# Patient Record
Sex: Female | Born: 1990 | Hispanic: Yes | Marital: Single | Smoking: Never smoker
Health system: Southern US, Community
[De-identification: ages and names within clinical notes are randomized; demographics above are authoritative.]

## PROBLEM LIST (undated history)

## (undated) DIAGNOSIS — K2970 Gastritis, unspecified, without bleeding: Secondary | ICD-10-CM

---

## 2019-06-21 LAB — OB RESULTS CONSOLE ANTIBODY SCREEN: Antibody Screen: NEGATIVE

## 2019-06-21 LAB — OB RESULTS CONSOLE RPR: RPR: NONREACTIVE

## 2019-06-21 LAB — OB RESULTS CONSOLE RUBELLA ANTIBODY, IGM: Rubella: IMMUNE

## 2019-06-21 LAB — OB RESULTS CONSOLE ABO/RH: RH Type: POSITIVE

## 2019-06-21 LAB — OB RESULTS CONSOLE HEPATITIS B SURFACE ANTIGEN: Hepatitis B Surface Ag: NEGATIVE

## 2019-06-21 LAB — OB RESULTS CONSOLE HIV ANTIBODY (ROUTINE TESTING): HIV: NONREACTIVE

## 2020-06-08 ENCOUNTER — Encounter (HOSPITAL_COMMUNITY): Payer: Self-pay | Admitting: Obstetrics & Gynecology

## 2020-06-08 ENCOUNTER — Inpatient Hospital Stay (HOSPITAL_COMMUNITY)
Admission: AD | Admit: 2020-06-08 | Discharge: 2020-06-08 | Disposition: A | Discharge status: Home / Self Care | Payer: Self-pay | Attending: Obstetrics & Gynecology | Admitting: Obstetrics & Gynecology

## 2020-06-08 ENCOUNTER — Other Ambulatory Visit: Payer: Self-pay

## 2020-06-08 DIAGNOSIS — K219 Gastro-esophageal reflux disease without esophagitis: Secondary | ICD-10-CM | POA: Insufficient documentation

## 2020-06-08 DIAGNOSIS — M549 Dorsalgia, unspecified: Secondary | ICD-10-CM | POA: Insufficient documentation

## 2020-06-08 DIAGNOSIS — Z3689 Encounter for other specified antenatal screening: Secondary | ICD-10-CM | POA: Insufficient documentation

## 2020-06-08 DIAGNOSIS — Z3A35 35 weeks gestation of pregnancy: Secondary | ICD-10-CM | POA: Insufficient documentation

## 2020-06-08 DIAGNOSIS — O99613 Diseases of the digestive system complicating pregnancy, third trimester: Secondary | ICD-10-CM | POA: Insufficient documentation

## 2020-06-08 DIAGNOSIS — O26893 Other specified pregnancy related conditions, third trimester: Secondary | ICD-10-CM | POA: Insufficient documentation

## 2020-06-08 HISTORY — DX: Gastritis, unspecified, without bleeding: K29.70

## 2020-06-08 LAB — COMPREHENSIVE METABOLIC PANEL
ALT: 22 U/L (ref 0–44)
AST: 45 U/L — ABNORMAL HIGH (ref 15–41)
Albumin: 2.8 g/dL — ABNORMAL LOW (ref 3.5–5.0)
Alkaline Phosphatase: 142 U/L — ABNORMAL HIGH (ref 38–126)
Anion gap: 6 (ref 5–15)
BUN: <5 mg/dL — ABNORMAL LOW (ref 6–20)
CO2: 21 mmol/L — ABNORMAL LOW (ref 22–32)
Calcium: 8.3 mg/dL — ABNORMAL LOW (ref 8.9–10.3)
Chloride: 108 mmol/L (ref 98–111)
Creatinine, Ser: 0.65 mg/dL (ref 0.44–1.00)
GFR, Estimated: >60 mL/min (ref >60)
Glucose, Bld: 113 mg/dL — ABNORMAL HIGH (ref 70–99)
Potassium: 3.6 mmol/L (ref 3.5–5.1)
Sodium: 135 mmol/L (ref 135–145)
Total Bilirubin: 0.7 mg/dL (ref 0.3–1.2)
Total Protein: 6.5 g/dL (ref 6.5–8.1)

## 2020-06-08 LAB — CBC
HCT: 32.3 % — ABNORMAL LOW (ref 36.0–46.0)
Hemoglobin: 10.5 g/dL — ABNORMAL LOW (ref 12.0–15.0)
MCH: 28.3 pg (ref 26.0–34.0)
MCHC: 32.5 g/dL (ref 30.0–36.0)
MCV: 87.1 fL (ref 80.0–100.0)
Platelets: 270 10*3/uL (ref 150–400)
RBC: 3.71 MIL/uL — ABNORMAL LOW (ref 3.87–5.11)
RDW: 13.1 % (ref 11.5–15.5)
WBC: 9 10*3/uL (ref 4.0–10.5)
nRBC: 0 % (ref 0.0–0.2)

## 2020-06-08 LAB — HEPATITIS B SURFACE ANTIGEN: Hepatitis B Surface Ag: NONREACTIVE

## 2020-06-08 LAB — DIFFERENTIAL
Abs Immature Granulocytes: 0.03 10*3/uL (ref 0.00–0.07)
Basophils Absolute: 0 10*3/uL (ref 0.0–0.1)
Basophils Relative: 0 %
Eosinophils Absolute: 0.1 10*3/uL (ref 0.0–0.5)
Eosinophils Relative: 1 %
Immature Granulocytes: 0 %
Lymphocytes Relative: 12 %
Lymphs Abs: 1.1 10*3/uL (ref 0.7–4.0)
Monocytes Absolute: 0.7 10*3/uL (ref 0.1–1.0)
Monocytes Relative: 8 %
Neutro Abs: 7.1 10*3/uL (ref 1.7–7.7)
Neutrophils Relative %: 79 %

## 2020-06-08 LAB — LIPASE, BLOOD: Lipase: 30 U/L (ref 11–51)

## 2020-06-08 LAB — HIV ANTIBODY (ROUTINE TESTING W REFLEX): HIV Screen 4th Generation wRfx: NONREACTIVE

## 2020-06-08 MED ORDER — FAMOTIDINE 20 MG PO TABS
20.0000 mg | ORAL_TABLET | Freq: Once | ORAL | Status: AC
  Administered 2020-06-08: 20 mg via ORAL
  Filled 2020-06-08: qty 1

## 2020-06-08 MED ORDER — LIDOCAINE VISCOUS HCL 2 % MT SOLN
15.0000 mL | Freq: Once | OROMUCOSAL | Status: AC
  Administered 2020-06-08: 15 mL via ORAL
  Filled 2020-06-08: qty 15

## 2020-06-08 MED ORDER — ALUM & MAG HYDROXIDE-SIMETH 200-200-20 MG/5ML PO SUSP
30.0000 mL | Freq: Once | ORAL | Status: AC
  Administered 2020-06-08: 30 mL via ORAL
  Filled 2020-06-08: qty 30

## 2020-06-08 MED ORDER — DICYCLOMINE HCL 10 MG/5ML PO SOLN
10.0000 mg | Freq: Once | ORAL | Status: AC
  Administered 2020-06-08: 10 mg via ORAL
  Filled 2020-06-08: qty 5

## 2020-06-08 NOTE — MAU Note (Signed)
Cassandra Durham is a 30 y.o. at [redacted]w[redacted]d here in MAU reporting: upper abdominal and back pain since early this morning. Pain is intermittent. No bleeding or LOF. +FM  Onset of complaint: this morning  Pain score: 8/10  Vitals:   06/08/20 1339  BP: 120/74  Pulse: 80  Resp: 16  Temp: 98.5 F (36.9 C)  SpO2: 100%     FHT: 154  Lab orders placed from triage: UA

## 2020-06-08 NOTE — Discharge Instructions (Signed)
Opciones de alimentos para pacientes adultos con enfermedad de reflujo gastroesofgico Food Choices for Gastroesophageal Reflux Disease, Adult Si tiene enfermedad de reflujo gastroesofgico (ERGE), los alimentos que consume y los hbitos de alimentacin son muy importantes. Elegir los alimentos adecuados puede ayudar a [employer]. Piense en consultar a un experto en alimentacin (nutricionista) para que lo ayude a Associate Professor. Consejos para seguir Consulting civil engineer las etiquetas de los alimentos  Elija alimentos que tengan bajo contenido de grasas saturadas. Los alimentos que pueden ayudar con los sntomas [employer] siguientes: ? Alimentos que tienen menos del 5% de los valores diarios (VD) de grasa. ? Alimentos que tienen 0 gramos de grasas trans. Al cocinar  No frer los alimentos.  Cocinar la comida al horno, al vapor, a la plancha o en la parrilla. Estos son mtodos que no necesitan mucha grasa para Water quality scientist.  Para agregar sabor, trate de consumir hierbas con bajo contenido de picante y [place]. Planificacin de las comidas  Elegir alimentos saludables con bajo contenido de [place], por ejemplo: ? Frutas y vegetales. ? Cereales integrales. ? Productos lcteos con bajo contenido de grasa. ? Vita Barley, pescado y aves.  Haga comidas pequeas frecuentes en lugar de 3 comidas abundantes al da. Coma lentamente, en un lugar donde est relajado. Evite agacharse o recostarse hasta 2 o 3horas despus de haber comido.  Limite los alimentos con alto contenido graso como las carnes grasas o los alimentos fritos.  Limite su ingesta de alimentos grasos, como aceites, [place] y [place].  Evite lo siguiente si el mdico se lo indica: ? Consumir alimentos que le ocasionen sntomas. Pueden ser distintos para cada persona. Lleve un registro de los alimentos para identificar aquellos que le causen sntomas. ? Alcohol. ? Beber mucha cantidad de lquido con las  comidas. ? Comer 2 o 3 horas antes de acostarse.   Estilo de vida  Mantenga un peso saludable. Pregntele a su mdico cul es el peso saludable para usted. Si necesita perder peso, hable con su mdico para hacerlo de manera segura.  Haga actividad fsica durante, al menos, 30 minutos 5 das por semana o ms, o como se lo haya indicado el mdico.  Use ropa suelta.  No fume ni consuma ningn producto que contenga nicotina o tabaco. Si necesita ayuda para dejar de fumar, consulte al mdico.  Duerma con la cabecera de la cama ms elevada que los pies. Use una cua debajo del colchn o bloques debajo del armazn de la cama para Pharmacologist la cabecera de la cama elevada.  Mastique chicle sin azcar despus de las comidas. Qu alimentos debo comer? Siga una dieta saludable y bien equilibrada que incluya abundantes frutas, verduras, cereales integrales, productos lcteos descremados, carnes magras, pescado y aves. Cada persona es diferente. Los alimentos que pueden causar sntomas en una persona pueden no causar sntomas en otra persona. Trabaje con el mdico para hallar alimentos que sean seguros para usted. Es posible que los productos que se enumeran ms [place] no constituyan una lista completa de lo que puede comer y Product manager. Pngase en contacto con un experto en alimentos para conocer ms opciones.   Qu alimentos debo evitar? Limitar algunos de estos alimentos puede ayudar a Chief Operating Officer los sntomas de [place]. Cada persona es diferente. Consulte a un experto en alimentacin o a su mdico para que lo ayude a Clinical research associate los alimentos exactos que debe evitar, si los hubiere. Frutas Cualquier fruta que est preparada con grasa agregada. Cualquier fruta que le  ocasione sntomas. Para algunas personas, estas pueden incluir, las frutas ctricas como naranja, pomelo, pia y limn. Verduras Verduras fritas en abundante aceite. Papas fritas. Cualquier verdura que est preparada con grasa agregada. Cualquier  verdura que le ocasione sntomas. Para algunas personas, estas pueden incluir tomates y productos con tomate, [place], cebollas y [place], y rbanos picantes. Granos Pasteles o panes sin levadura con grasa agregada. Carnes y [address] protenas [address] de alto contenido graso como carne grasa de vaca o cerdo, salchichas, costillas, jamn, salchicha, salame y tocino. Carnes o protenas fritas, lo que incluye pescado frito y pollo frito. Frutos secos y Civil engineer, contracting de frutos secos, en grandes cantidades. Lcteos Leche entera y [place] con chocolate. Tami Ribas. Crema. Helado. Queso crema. Batidos con [employer]. Grasas y [employer]. Margarina. Lardo. Mantequilla clarificada. Bebidas Caf y t negro, con o sin cafena. Bebidas con gas. Refrescos. Bebidas energizantes. Jugo de fruta hecho con frutas cidas, como naranja o pomelo. Jugo de tomate. Bebidas alcohlicas. Dulces y postres Chocolate y cacao. Rosquillas. Alios y condimentos Pimienta. Menta y mentol. Sal agregada. Cualquier condimento, hierbas o aderezos que le ocasionen sntomas. Para algunas personas, esto puede incluir curry, salsa picante o aderezos para ensalada a base de vinagre. Es posible que los productos que se enumeran ms arriba no constituyan una lista completa de lo que no debera comer y Product manager. Pngase en contacto con un experto en alimentos para conocer ms opciones. Preguntas para hacerle al [employer] Los cambios en la dieta y en el estilo de vida a menudo son los primeros pasos que se toman para Company secretary los sntomas de [place]. Si los [employer] dieta y el estilo de vida no ayudan, hable con el mdico sobre el uso de medicamentos. Dnde buscar ms informacin  Arts development officer for Gastrointestinal Disorders (Fundacin Internacional para los Trastornos Gastrointestinales): aboutgerd.org Resumen  Si tiene [employer], las elecciones de alimentos y el [place] de vida son muy importantes para ayudar a Consulting civil engineer sntomas.  Haga  comidas pequeas durante Glass blower/designer de 3 comidas abundantes. Coma lentamente y en un lugar donde est relajado.  Evite agacharse o recostarse hasta 2 o 3horas despus de haber comido.  Limite los alimentos con alto contenido graso como las carnes grasas o los alimentos fritos. Esta informacin no tiene Theme park manager el consejo del mdico. Asegrese de hacerle al mdico cualquier pregunta que tenga. Document Revised: 08/20/2019 Document Reviewed: 08/20/2019 Elsevier Patient Education  2021 [employer].

## 2020-06-08 NOTE — MAU Provider Note (Signed)
Chief Complaint:  Abdominal Pain and Back Pain   Event Date/Time   First Provider Initiated Contact with Patient 06/08/20 1357     HPI: Cassandra Durham is a 30 y.o. G3P2002 at 108w6d who presents to maternity admissions reporting epigastric pain that radiates to her upper back. Denies nausea/vomiting or reflux symptoms and does not think it feels like contractions. She has not taken anything for the pain and is on no meds. Denies vaginal bleeding, leaking of fluid, decreased fetal movement, fever, falls, or recent illness.   Pregnancy Course: Has not established OB care anywhere, got pregnancy verification at [facility] at 29wks.  History reviewed. No pertinent past medical history. OB History  Gravida Para Term Preterm AB Living  3 2 2     2   SAB IAB Ectopic Multiple Live Births          2    # Outcome Date GA Lbr Len/2nd Weight Sex Delivery Anes PTL Lv  3 Current           2 Term 10/21/16    12/21/16   LIV  1 Term 09/30/10    M CS-[id number]   LIV   History reviewed. No pertinent surgical history. History reviewed. No pertinent family history.   No Known Allergies No medications prior to admission.   I have reviewed patient's Past Medical Hx, Surgical Hx, Family Hx, Social Hx, medications and allergies.   ROS:  Pertinent items noted in HPI and remainder of comprehensive ROS otherwise negative.  Physical Exam   Patient Vitals for the past 24 hrs:  BP Temp Temp src Pulse Resp SpO2 Height Weight  06/08/20 1352 127/77 -- -- 91 -- -- -- --  06/08/20 1339 120/74 98.5 F (36.9 C) Oral 80 16 100 % 5' 5.75" (1.67 m) 181 lb 3.2 oz (82.2 kg)   Constitutional: Well-developed, well-nourished female in no acute distress.  Cardiovascular: normal rate & rhythm, no murmur Respiratory: normal effort, lung sounds clear throughout GI: Abd soft, epigastric tenderness, none anywhere else, gravid appropriate for gestational age. Pos BS x 4 MS: Extremities nontender, no edema, normal  ROM Neurologic: Alert and oriented x 4.  GU: no CVA tenderness Pelvic exam deferred  Fetal Tracing at 1415: reactive Baseline: 145 Variability: moderate Accelerations: 15x15 Decelerations: none Toco: relaxed with occasional UI   Labs: Labs normal except for mild anemia and findings consistent with pregnancy. None responsible for epigastric/back pain.  Imaging:  No labs ordered  MAU Course: Orders Placed This Encounter  Procedures  . Hepatitis B surface antigen  . Rubella screen  . RPR  . CBC  . Differential  . HIV Antibody (routine testing w rflx)  . Comprehensive metabolic panel  . Lipase, blood  . Type and screen [facility]   Meds ordered this encounter  Medications  . AND Linked Order Group   . alum & mag hydroxide-simeth (MAALOX/MYLANTA) 200-200-20 MG/5ML suspension 30 mL   . lidocaine (XYLOCAINE) 2 % viscous mouth solution 15 mL  . dicyclomine (BENTYL) 10 MG/5ML solution 10 mg   MDM: Pt expressed relief after GI cocktail.  Assessment: GERD in pregnancy NST reactive  Plan: Discharge home in stable condition with preterm labor precautions.  Encouraged to begin care at [facility] (has appt for 05/2820)  06/2820, CNM, MSN, IBCLC Certified Nurse Midwife, [facility]

## 2020-06-09 ENCOUNTER — Inpatient Hospital Stay (HOSPITAL_COMMUNITY)
Admission: AD | Admit: 2020-06-09 | Discharge: 2020-06-09 | Disposition: A | Discharge status: Home / Self Care | Payer: Self-pay | Attending: Obstetrics & Gynecology | Admitting: Obstetrics & Gynecology

## 2020-06-09 ENCOUNTER — Inpatient Hospital Stay (HOSPITAL_COMMUNITY): Payer: Self-pay

## 2020-06-09 ENCOUNTER — Encounter (HOSPITAL_COMMUNITY): Payer: Self-pay | Admitting: Obstetrics & Gynecology

## 2020-06-09 DIAGNOSIS — O99613 Diseases of the digestive system complicating pregnancy, third trimester: Secondary | ICD-10-CM | POA: Insufficient documentation

## 2020-06-09 DIAGNOSIS — R1013 Epigastric pain: Secondary | ICD-10-CM | POA: Insufficient documentation

## 2020-06-09 DIAGNOSIS — K219 Gastro-esophageal reflux disease without esophagitis: Secondary | ICD-10-CM | POA: Insufficient documentation

## 2020-06-09 DIAGNOSIS — R102 Pelvic and perineal pain: Secondary | ICD-10-CM

## 2020-06-09 DIAGNOSIS — R7989 Other specified abnormal findings of blood chemistry: Secondary | ICD-10-CM | POA: Insufficient documentation

## 2020-06-09 DIAGNOSIS — O26893 Other specified pregnancy related conditions, third trimester: Secondary | ICD-10-CM | POA: Insufficient documentation

## 2020-06-09 DIAGNOSIS — Z3A36 36 weeks gestation of pregnancy: Secondary | ICD-10-CM | POA: Insufficient documentation

## 2020-06-09 LAB — CBC WITH DIFFERENTIAL/PLATELET
Abs Immature Granulocytes: 0.04 10*3/uL (ref 0.00–0.07)
Basophils Absolute: 0 10*3/uL (ref 0.0–0.1)
Basophils Relative: 0 %
Eosinophils Absolute: 0.1 10*3/uL (ref 0.0–0.5)
Eosinophils Relative: 1 %
HCT: 31.3 % — ABNORMAL LOW (ref 36.0–46.0)
Hemoglobin: 10.5 g/dL — ABNORMAL LOW (ref 12.0–15.0)
Immature Granulocytes: 1 %
Lymphocytes Relative: 13 %
Lymphs Abs: 1.2 10*3/uL (ref 0.7–4.0)
MCH: 28.9 pg (ref 26.0–34.0)
MCHC: 33.5 g/dL (ref 30.0–36.0)
MCV: 86.2 fL (ref 80.0–100.0)
Monocytes Absolute: 0.8 10*3/uL (ref 0.1–1.0)
Monocytes Relative: 9 %
Neutro Abs: 6.7 10*3/uL (ref 1.7–7.7)
Neutrophils Relative %: 76 %
Platelets: 265 10*3/uL (ref 150–400)
RBC: 3.63 MIL/uL — ABNORMAL LOW (ref 3.87–5.11)
RDW: 13.2 % (ref 11.5–15.5)
WBC: 8.9 10*3/uL (ref 4.0–10.5)
nRBC: 0 % (ref 0.0–0.2)

## 2020-06-09 LAB — HEPATITIS PANEL, ACUTE
HCV Ab: NONREACTIVE
Hep A IgM: NONREACTIVE
Hep B C IgM: NONREACTIVE
Hepatitis B Surface Ag: NONREACTIVE

## 2020-06-09 LAB — COMPREHENSIVE METABOLIC PANEL
ALT: 26 U/L (ref 0–44)
AST: 42 U/L — ABNORMAL HIGH (ref 15–41)
Albumin: 2.7 g/dL — ABNORMAL LOW (ref 3.5–5.0)
Alkaline Phosphatase: 132 U/L — ABNORMAL HIGH (ref 38–126)
Anion gap: 7 (ref 5–15)
BUN: 5 mg/dL — ABNORMAL LOW (ref 6–20)
CO2: 20 mmol/L — ABNORMAL LOW (ref 22–32)
Calcium: 8.5 mg/dL — ABNORMAL LOW (ref 8.9–10.3)
Chloride: 106 mmol/L (ref 98–111)
Creatinine, Ser: 0.61 mg/dL (ref 0.44–1.00)
GFR, Estimated: >60 mL/min (ref >60)
Glucose, Bld: 93 mg/dL (ref 70–99)
Potassium: 3.8 mmol/L (ref 3.5–5.1)
Sodium: 133 mmol/L — ABNORMAL LOW (ref 135–145)
Total Bilirubin: 0.7 mg/dL (ref 0.3–1.2)
Total Protein: 6.4 g/dL — ABNORMAL LOW (ref 6.5–8.1)

## 2020-06-09 LAB — TYPE AND SCREEN
ABO/RH(D): O POS
Antibody Screen: NEGATIVE

## 2020-06-09 LAB — SYPHILIS: RPR W/REFLEX TO RPR TITER AND TREPONEMAL ANTIBODIES, TRADITIONAL SCREENING AND DIAGNOSIS ALGORITHM: RPR Ser Ql: NONREACTIVE

## 2020-06-09 LAB — LIPASE, BLOOD: Lipase: 34 U/L (ref 11–51)

## 2020-06-09 LAB — RUBELLA SCREEN: Rubella: 1.75 index (ref >0.99)

## 2020-06-09 MED ORDER — LIDOCAINE VISCOUS HCL 2 % MT SOLN
15.0000 mL | Freq: Once | OROMUCOSAL | Status: AC
  Administered 2020-06-09: 15 mL via ORAL
  Filled 2020-06-09: qty 15

## 2020-06-09 MED ORDER — PANTOPRAZOLE SODIUM 40 MG PO TBEC: 40.0000 mg | DELAYED_RELEASE_TABLET | Freq: Every day | ORAL | 0 refills | Status: DC

## 2020-06-09 MED ORDER — ALUM & MAG HYDROXIDE-SIMETH 200-200-20 MG/5ML PO SUSP
30.0000 mL | Freq: Once | ORAL | Status: AC
  Administered 2020-06-09: 30 mL via ORAL
  Filled 2020-06-09: qty 30

## 2020-06-09 MED ORDER — PANTOPRAZOLE SODIUM 40 MG PO TBEC
40.0000 mg | DELAYED_RELEASE_TABLET | Freq: Once | ORAL | Status: AC
  Administered 2020-06-09: 40 mg via ORAL
  Filled 2020-06-09: qty 1

## 2020-06-09 NOTE — Discharge Instructions (Signed)
Heartburn During Pregnancy Heartburn is a type of pain or discomfort that can happen in the throat or chest. It is often described as a burning sensation. Heartburn is common during pregnancy because:  Progesterone, a hormone that is released during pregnancy, may relax the valve that separates the esophagus from the stomach (lower esophageal sphincter, or LES). This allows stomach acid to move up into the esophagus, causing heartburn.  The uterus gets larger and pushes up on the stomach, which pushes more acid into the esophagus. This is especially true in the later stages of pregnancy. Heartburn usually goes away or gets better after giving birth. What are the causes? This condition is caused by stomach acid backing up into the esophagus (reflux). Reflux can be triggered by:  Changing hormone levels.  Large meals.  Certain foods and beverages, such as coffee, chocolate, onions, and peppermint.  Exercise.  Increased stomach acid production. What increases the risk? You are more likely to develop this condition if:  You had heartburn prior to becoming pregnant.  You have been pregnant more than once before.  You are overweight or obese. The likelihood that you will get heartburn also increases as you get further along in your pregnancy, especially during the last trimester. What are the signs or symptoms? Symptoms of this condition include:  Burning pain in the chest or lower throat.  A bitter taste in the mouth.  Coughing.  Problems swallowing.  Vomiting.  A hoarse voice.  Asthma. Symptoms may get worse when you lie down or bend over. Symptoms are often worse at night. How is this diagnosed? This condition is diagnosed based on:  Your medical history.  Your symptoms.  Blood tests to check for a certain type of bacteria that is associated with heartburn.  Whether taking heartburn medicine relieves your symptoms.  An examination of the stomach and esophagus  using a tube that has a light and camera (endoscopy). How is this treated? Treatment for this condition depends on how severe your symptoms are. Your health care provider may recommend:  Over-the-counter medicines for mild heartburn, such as antacids or acid reducers.  Prescription medicines to decrease stomach acid or to protect your stomach lining.  Certain changes in your diet.  Raising the head of your bed so it is higher than the foot of the bed. This helps prevent stomach acid from backing up into the esophagus when you are lying down. Follow these instructions at home: Eating and drinking  Do not drink alcohol during your pregnancy.  Identify foods and beverages that make your symptoms worse and avoid them.  Eat small, frequent meals instead of large meals.  Avoid drinking large amounts of liquid with your meals.  Avoid eating meals during the 2-3 hours before bedtime.  Avoid lying down right after you eat.  Do not exercise right after you eat. Beverages to avoid  Coffee and tea, with or without caffeine.  Energy drinks and sports drinks.  Carbonated drinks or sodas.  Citrus fruit juices. Foods to avoid  Chocolate and cocoa.  Peppermint and mint flavorings.  Garlic, onions, and horseradish.  Spicy and acidic foods, including peppers, chili powder, curry powder, vinegar, hot sauces, and barbecue sauce.  Citrus fruits, such as oranges, lemons, and limes.  Tomato-based foods, such as red sauce, chili, and salsa.  Fried and fatty foods, such as donuts, french fries, potato chips, and high-fat dressings.  High-fat meats, such as hot dogs, precooked or cured meat, sausage, ham, and bacon.    High-fat dairy items, such as whole milk, butter, and cheese. Medicines  Take over-the-counter and prescription medicines only as told by your health care provider.  Do not take aspirin or NSAIDs, such as ibuprofen, unless your health care provider tells you to take  them.  You may be instructed to avoid medicines that contain sodium bicarbonate. General instructions  If directed, raise the head of your bed about 6 inches (15 cm) by putting blocks under the legs. Sleeping with more pillows does not effectively relieve heartburn because it only changes the position of your head.  Do not use any products that contain nicotine or tobacco, such as cigarettes, e-cigarettes, and chewing tobacco. If you need help quitting, ask your health care provider.  Wear loose-fitting clothing.  Try to reduce your stress, such as with yoga or meditation. If you need help managing stress, ask your health care provider.  Maintain a healthy weight. If you are overweight, work with your health care provider to safely manage your weight.  Keep all follow-up visits as told by your health care provider. This is important. Where to find more information  American Pregnancy Association: americanpregnancy.org Contact a health care provider if:  Your symptoms do not improve with treatment, or you develop new symptoms.  You have unexplained weight loss.  You have difficulty swallowing.  You make loud sounds when you breathe (wheeze).  You have a cough that does not go away.  You have frequent heartburn for more than 2 weeks.  You have nausea or vomiting that does not get better with treatment.  You have pain in your abdomen. Get help right away if:  You have severe chest pain that spreads to your arm, neck, or jaw.  You feel sweaty, dizzy, or light-headed.  You have shortness of breath.  You have pain when swallowing.  You vomit, and your vomit looks like blood or coffee grounds.  Your stool is bloody or black. Summary  Heartburn in pregnancy is common, especially during the last trimester.  This condition is caused by stomach acid backing up into the esophagus (reflux).  This condition can be treated with medicines, changes to your diet, or elevating the  head of your bed.  Keep all follow-up visits as told by your health care provider. This is important. This information is not intended to replace advice given to you by your health care provider. Make sure you discuss any questions you have with your health care provider. Document Revised: 09/30/2018 Document Reviewed: 09/30/2018 Elsevier Patient Education  [place].

## 2020-06-09 NOTE — MAU Note (Signed)
..  Cassandra Durham is a 30 y.o. at [redacted]w[redacted]d here in MAU reporting: constant upper abdominal pain that radiates to her back. She states that she was seen in MAU yon 06/08/20 for the same pain but it has not improved. Denies vaginal bleeding, leaking of fluid, or contractions. +FM.  Patient states she had this pain 2 years ago and [place] diagnosed her with gastritis and they gave her injections and adjusted her diet.  Onset of complaint: 06/08/2020 Pain score: 10/10 Vitals:   06/09/20 0059  BP: 125/77  Pulse: 70  Resp: 16  Temp: 98.7 F (37.1 C)  SpO2: 99%

## 2020-06-09 NOTE — MAU Provider Note (Signed)
History     CSN: [id number]  Arrival date and time: 06/09/20 4627   Event Date/Time   First Provider Initiated Contact with Patient 06/09/20 0111     *Video Spanish interpreter used for this encounter*   Chief Complaint  Patient presents with  . Abdominal Pain   HPI Cassandra Durham is a 30 y.o. G3P2002 at [redacted]w[redacted]d who presents with abdominal pain. Was seen for same complaint earlier today. Reports mid epigastric pain that radiates to her mid back. Rates pain 10/10 & describes as cramping. Pain improved with medication that was given to her in MAU. Pain worse with eating. Has not treated symptoms at home. Reports she has only had an apple, carrot juice, and water since her previous MAU visit. States years ago she was diagnosed with gastritis with similar symptoms.  Denies fever, n/v/d, contractions, LOF, or vaginal bleeding. Reports good fetal movement.   OB History    Gravida  3   Para  2   Term  2   Preterm      AB      Living  2     SAB      IAB      Ectopic      Multiple      Live Births  2           Past Medical History:  Diagnosis Date  . Gastritis     Past Surgical History:  Procedure Laterality Date  . CESAREAN SECTION      History reviewed. No pertinent family history.  Social History   Tobacco Use  . Smoking status: Never Smoker  . Smokeless tobacco: Never Used  Substance Use Topics  . Alcohol use: Not Currently  . Drug use: Never    Allergies: No Known Allergies  No medications prior to admission.    Review of Systems  Constitutional: Negative.   Gastrointestinal: Positive for abdominal pain. Negative for constipation, diarrhea, nausea and vomiting.  Genitourinary: Negative.   Musculoskeletal: Positive for back pain.   Physical Exam   Blood pressure 125/77, pulse 70, temperature 98.7 F (37.1 C), temperature source Oral, resp. rate 16, SpO2 99 %.  Physical Exam Vitals and nursing note reviewed.  Constitutional:       General: She is not in acute distress.    Appearance: She is well-developed.  HENT:     Head: Normocephalic and atraumatic.  Cardiovascular:     Heart sounds: Normal heart sounds.  Pulmonary:     Effort: Pulmonary effort is normal. No respiratory distress.     Breath sounds: Normal breath sounds.  Abdominal:     General: Bowel sounds are normal.     Palpations: Abdomen is soft.     Tenderness: There is abdominal tenderness in the epigastric area. Negative signs include Murphy's sign.  Skin:    General: Skin is warm and dry.  Neurological:     Mental Status: She is alert.  Psychiatric:        Mood and Affect: Mood normal.        Behavior: Behavior normal.     NST:  Baseline: 145 bpm, Variability: Good {> 6 bpm), Accelerations: Reactive and Decelerations: Absent  MAU Course  Procedures Results for orders placed or performed during the hospital encounter of 06/09/20 (from the past 24 hour(s))  CBC with Differential/Platelet     Status: Abnormal   Collection Time: 06/09/20  1:34 AM  Result Value Ref Range   WBC 8.9 4.0 - 10.5  K/uL   RBC 3.63 (L) 3.87 - 5.11 MIL/uL   Hemoglobin 10.5 (L) 12.0 - 15.0 g/dL   HCT 29.7 (L) 98.9 - 21.1 %   MCV 86.2 80.0 - 100.0 fL   MCH 28.9 26.0 - 34.0 pg   MCHC 33.5 30.0 - 36.0 g/dL   RDW 94.1 74.0 - 81.4 %   Platelets 265 150 - 400 K/uL   nRBC 0.0 0.0 - 0.2 %   Neutrophils Relative % 76 %   Neutro Abs 6.7 1.7 - 7.7 K/uL   Lymphocytes Relative 13 %   Lymphs Abs 1.2 0.7 - 4.0 K/uL   Monocytes Relative 9 %   Monocytes Absolute 0.8 0.1 - 1.0 K/uL   Eosinophils Relative 1 %   Eosinophils Absolute 0.1 0.0 - 0.5 K/uL   Basophils Relative 0 %   Basophils Absolute 0.0 0.0 - 0.1 K/uL   Immature Granulocytes 1 %   Abs Immature Granulocytes 0.04 0.00 - 0.07 K/uL  Comprehensive metabolic panel     Status: Abnormal   Collection Time: 06/09/20  1:34 AM  Result Value Ref Range   Sodium 133 (L) 135 - 145 mmol/L   Potassium 3.8 3.5 - 5.1 mmol/L    Chloride 106 98 - 111 mmol/L   CO2 20 (L) 22 - 32 mmol/L   Glucose, Bld 93 70 - 99 mg/dL   BUN 5 (L) 6 - 20 mg/dL   Creatinine, Ser 4.81 0.44 - 1.00 mg/dL   Calcium 8.5 (L) 8.9 - 10.3 mg/dL   Total Protein 6.4 (L) 6.5 - 8.1 g/dL   Albumin 2.7 (L) 3.5 - 5.0 g/dL   AST 42 (H) 15 - 41 U/L   ALT 26 0 - 44 U/L   Alkaline Phosphatase 132 (H) 38 - 126 U/L   Total Bilirubin 0.7 0.3 - 1.2 mg/dL   GFR, Estimated >85 >63 mL/min   Anion gap 7 5 - 15  Lipase, blood     Status: None   Collection Time: 06/09/20  1:34 AM  Result Value Ref Range   Lipase 34 11 - 51 U/L   US Abdomen Complete  Result Date: 06/09/2020 CLINICAL DATA:  Epigastric abdominal pain, elevated liver function tests, pregnant EXAM: ABDOMEN ULTRASOUND COMPLETE COMPARISON:  None. FINDINGS: Gallbladder: No gallstones or wall thickening visualized. No sonographic Murphy sign noted by sonographer. Common bile duct: Diameter: 7 mm in proximal diameter Liver: There is slightly limited evaluation of the right hepatic dome. The visualized liver, however, demonstrates normal parenchymal echogenicity and echotexture. Liver size is within normal limits. No intrahepatic masses are identified. No intrahepatic biliary ductal dilation. No perihepatic fluid collections are seen. Portal vein is patent on color Doppler imaging with normal direction of blood flow towards the liver. IVC: No abnormality visualized. Pancreas: Obscured by overlying bowel gas Spleen: Size and appearance within normal limits. Right Kidney: Length: 10.1 cm. Echogenicity within normal limits. No mass or hydronephrosis visualized. Left Kidney: Length: 11.1 cm. Echogenicity within normal limits. No mass or hydronephrosis visualized. Abdominal aorta: Partially obscured by overlying bowel gas, however, the proximal and distal segments are normal in diameter. Other findings: None. IMPRESSION: Dilation of the extrahepatic bile duct. A distal obstructing process, such as choledocholithiasis  is not excluded. ERCP or MRCP examination may be more helpful for further evaluation in this patient with reported abnormal liver function tests. Electronically Signed   By: Helyn Numbers MD   On: 06/09/2020 02:21    MDM This is patient's second MAU  visit in 12 hours for same symptoms. Will repeat labs, add hepatitis panel d/t elevated AST in this setting, and get abdominal ultrasound.   Reactive fetal tracing & no OB complaints.   Maalox & viscous lidocaine given as well as 40 mg of oral protonix. Patient reports improvement in symptoms & appears more comfortable.   Reviewed labs & ultrasound with Dr. Macon Large. Patient is stable for discharge. Suspect this is gastric in nature d/t location of symptoms & improvement with meds. She has negative murphy's sign, is afebrile, and denies n/v. Will start on daily protonix & have her f/u outpatient.    Assessment and Plan   1. Gastroesophageal reflux disease without esophagitis   2. Epigastric pain   3. Elevated LFTs   4. [redacted] weeks gestation of pregnancy    -msg to [facility] for f/u with MD to discuss mode of delivery (hx of c/section x 2) -Rx protoni 40 mg daily -reviewed reasons to return to MAU  Judeth Horn 06/09/2020, 3:38 AM

## 2020-06-28 ENCOUNTER — Encounter: Payer: Self-pay | Admitting: *Deleted

## 2020-06-29 ENCOUNTER — Telehealth: Payer: Self-pay | Admitting: *Deleted

## 2020-06-29 ENCOUNTER — Other Ambulatory Visit: Payer: Self-pay | Admitting: Obstetrics and Gynecology

## 2020-06-29 NOTE — Telephone Encounter (Signed)
[id number]. Advised c-section scheduled for 07-19-20 at 0930- arrive 0730. Advised will receive letter in the mail and phone call from pre-op nurse.   Encounter closed.

## 2020-06-30 ENCOUNTER — Other Ambulatory Visit: Payer: Self-pay | Admitting: Family

## 2020-06-30 DIAGNOSIS — Z363 Encounter for antenatal screening for malformations: Secondary | ICD-10-CM

## 2020-07-05 ENCOUNTER — Encounter: Payer: Self-pay | Admitting: Family Medicine

## 2020-07-05 ENCOUNTER — Ambulatory Visit (INDEPENDENT_AMBULATORY_CARE_PROVIDER_SITE_OTHER): Payer: Medicaid Other | Admitting: Family Medicine

## 2020-07-05 ENCOUNTER — Other Ambulatory Visit: Payer: Self-pay

## 2020-07-05 VITALS — BP 123/77 | HR 101 | Ht 65.0 in | Wt 185.8 lb

## 2020-07-05 DIAGNOSIS — O34219 Maternal care for unspecified type scar from previous cesarean delivery: Secondary | ICD-10-CM

## 2020-07-05 NOTE — Progress Notes (Signed)
   PRENATAL VISIT NOTE  Subjective:  Cassandra Durham is a 30 y.o. G3P2002 at [redacted]w[redacted]d being seen today for  prenatal care. Originally, thought to have no PNC--but has been seen at the [facility] and established care there. Has f/u appointment on 6/ 20.  She is currently monitored for the following issues for this low-risk pregnancy and does not have a problem list on file.  Patient reports no complaints.  Contractions: Not present. Vag. Bleeding: None.  Movement: Present. Denies leaking of fluid.   The following portions of the patient's history were reviewed and updated as appropriate: allergies, current medications, past family history, past medical history, past social history, past surgical history and problem list.   Objective:   Vitals:   07/05/20 1619 07/05/20 1622  BP: 123/77   Pulse: (!) 101   Weight: 185 lb 12.8 oz (84.3 kg)   Height:  5\' 5"  (1.651 m)    Fetal Status: Fetal Heart Rate (bpm): 153   Movement: Present     General:  Alert, oriented and cooperative. Patient is in no acute distress.  Skin: Skin is warm and dry. No rash noted.   Cardiovascular: Normal heart rate noted  Respiratory: Normal respiratory effort, no problems with respiration noted  Abdomen: Soft, gravid, appropriate for gestational age.  Pain/Pressure: Absent     Pelvic: Cervical exam deferred        Extremities: Normal range of motion.  Edema: None  Mental Status: Normal mood and affect. Normal behavior. Normal judgment and thought content.   Assessment and Plan:  Pregnancy: G3P2002 at [redacted]w[redacted]d Has C-section scheduled at 39 weeks Return to [facility]. Term labor symptoms and general obstetric precautions including but not limited to vaginal bleeding, contractions, leaking of fluid and fetal movement were reviewed in detail with the patient. Please refer to After Visit Summary for other counseling recommendations.   No follow-ups on file.  Future Appointments  Date Time Provider Department Center   07/13/2020  7:15 AM [facility] NURSE [facility]  07/13/2020  7:30 AM [facility]    07/15/2020, MD

## 2020-07-06 ENCOUNTER — Other Ambulatory Visit: Payer: Self-pay | Admitting: Student

## 2020-07-07 ENCOUNTER — Encounter: Payer: Self-pay | Admitting: Family Medicine

## 2020-07-10 NOTE — Patient Instructions (Addendum)
Instrucciones Para Antes de la Ciruga   Su ciruga est programada para 07/19/2020  (your procedure is scheduled on) Entre por la entrada principal del [facility]  a las 0730 de la [place] -(enter through the main entrance at [facility] at [employer] AM    [employer] [place], [place] [address] para informarnos de su llegada. (pick up phone, dial [phone] on arrival)     Por favor llame al [phone] si tiene algn problema en la maana de la ciruga (please call this number if you have any problems the morning of surgery.)                  Recuerde: (Remember)  No coma alimentos. (Do not eat food (After Midnight) Desps de medianoche)    No tome lquidos claros. (Do not drink clear liquids (After Midnight) Desps de medianoche)    No use joyas, maquillaje de ojos, lpiz labial, crema para el cuerpo o esmalte de uas oscuro. (Do not wear jewelry, eye makeup, lipstick, body lotion, or dark fingernail polish). Puede usar desodorante (you may wear deodorant)    No se afeite 48 horas de su ciruga. (Do not shave 48 hours before your surgery)    No traiga objetos de valor al hospital.  [place] no se hace responsable de ninguna pertenencia, ni objetos de valor que haya trado al hospital. (Do not bring valuable to the hospital.  [place] is not responsible for any belongings or valuables brought to the hospital)   [facility] medicinas en la maana de la ciruga con un SORBITO de agua nada (take these meds the morning of surgery with a SIP of water)     Durante la ciruga no se pueden usar lentes de contacto, dentaduras o puentes. (Contacts, dentures or bridgework cannot be worn in surgery).   Si va a ser ingresado despus de la ciruga, deje la [employer] en el carro hasta que se le haya asignado una habitacin. (If you are to be admitted after surgery, leave suitcase in car until your room has been assigned.)   A los pacientes que se les d de alta  el mismo da no se les permitir manejar a casa.  (Patients discharged on the day of surgery will not be allowed to drive home)    [place] y nmero de telfono del Programmer, multimedia na. (Name and telephone number of your driver)   Instrucciones especiales N/A (Special Instructions)   Por favor, lea las hojas informativas que le entregaron. (Please read over the following fact sheets that you were given) Surgical Site Infection Prevention

## 2020-07-11 ENCOUNTER — Telehealth (HOSPITAL_COMMUNITY): Payer: Self-pay | Admitting: *Deleted

## 2020-07-11 NOTE — Pre-Procedure Instructions (Signed)
[id number] interpreter number

## 2020-07-11 NOTE — Telephone Encounter (Signed)
Preadmission screen  

## 2020-07-12 ENCOUNTER — Encounter: Payer: Self-pay | Admitting: Obstetrics and Gynecology

## 2020-07-13 ENCOUNTER — Encounter (HOSPITAL_COMMUNITY)
Admission: AD | Disposition: A | Discharge status: Home / Self Care | Payer: Self-pay | Source: Home / Self Care | Attending: Obstetrics and Gynecology

## 2020-07-13 ENCOUNTER — Inpatient Hospital Stay (HOSPITAL_COMMUNITY): Payer: Medicaid Other | Admitting: Anesthesiology

## 2020-07-13 ENCOUNTER — Ambulatory Visit: Payer: Self-pay | Attending: Obstetrics and Gynecology

## 2020-07-13 ENCOUNTER — Encounter (HOSPITAL_COMMUNITY): Payer: Self-pay | Admitting: Obstetrics & Gynecology

## 2020-07-13 ENCOUNTER — Other Ambulatory Visit: Payer: Self-pay | Admitting: Family

## 2020-07-13 ENCOUNTER — Inpatient Hospital Stay (HOSPITAL_COMMUNITY)
Admission: AD | Admit: 2020-07-13 | Discharge: 2020-07-15 | DRG: 785 | Disposition: A | Discharge status: Home / Self Care | Payer: Medicaid Other | Attending: Obstetrics and Gynecology | Admitting: Obstetrics and Gynecology

## 2020-07-13 ENCOUNTER — Encounter: Payer: Self-pay | Admitting: *Deleted

## 2020-07-13 ENCOUNTER — Ambulatory Visit: Payer: Self-pay | Admitting: *Deleted

## 2020-07-13 ENCOUNTER — Other Ambulatory Visit: Payer: Self-pay

## 2020-07-13 VITALS — BP 141/95 | HR 89

## 2020-07-13 DIAGNOSIS — Z302 Encounter for sterilization: Secondary | ICD-10-CM

## 2020-07-13 DIAGNOSIS — Z3A38 38 weeks gestation of pregnancy: Secondary | ICD-10-CM

## 2020-07-13 DIAGNOSIS — O134 Gestational [pregnancy-induced] hypertension without significant proteinuria, complicating childbirth: Secondary | ICD-10-CM | POA: Diagnosis present

## 2020-07-13 DIAGNOSIS — Z20822 Contact with and (suspected) exposure to covid-19: Secondary | ICD-10-CM | POA: Diagnosis present

## 2020-07-13 DIAGNOSIS — O34211 Maternal care for low transverse scar from previous cesarean delivery: Principal | ICD-10-CM | POA: Diagnosis present

## 2020-07-13 DIAGNOSIS — R030 Elevated blood-pressure reading, without diagnosis of hypertension: Secondary | ICD-10-CM

## 2020-07-13 DIAGNOSIS — O139 Gestational [pregnancy-induced] hypertension without significant proteinuria, unspecified trimester: Secondary | ICD-10-CM | POA: Diagnosis not present

## 2020-07-13 DIAGNOSIS — O99214 Obesity complicating childbirth: Secondary | ICD-10-CM | POA: Diagnosis present

## 2020-07-13 DIAGNOSIS — O0933 Supervision of pregnancy with insufficient antenatal care, third trimester: Secondary | ICD-10-CM

## 2020-07-13 DIAGNOSIS — Z363 Encounter for antenatal screening for malformations: Secondary | ICD-10-CM

## 2020-07-13 DIAGNOSIS — Z98891 History of uterine scar from previous surgery: Secondary | ICD-10-CM

## 2020-07-13 DIAGNOSIS — D62 Acute posthemorrhagic anemia: Secondary | ICD-10-CM | POA: Diagnosis not present

## 2020-07-13 LAB — DIFFERENTIAL
Abs Immature Granulocytes: 0.04 10*3/uL (ref 0.00–0.07)
Basophils Absolute: 0 10*3/uL (ref 0.0–0.1)
Basophils Relative: 1 %
Eosinophils Absolute: 0.1 10*3/uL (ref 0.0–0.5)
Eosinophils Relative: 1 %
Immature Granulocytes: 1 %
Lymphocytes Relative: 16 %
Lymphs Abs: 1.4 10*3/uL (ref 0.7–4.0)
Monocytes Absolute: 0.7 10*3/uL (ref 0.1–1.0)
Monocytes Relative: 8 %
Neutro Abs: 6.5 10*3/uL (ref 1.7–7.7)
Neutrophils Relative %: 73 %

## 2020-07-13 LAB — PROTEIN / CREATININE RATIO, URINE
Creatinine, Urine: 150.66 mg/dL
Protein Creatinine Ratio: 0.23 mg/mg{Cre} — ABNORMAL HIGH (ref 0.00–0.15)
Total Protein, Urine: 35 mg/dL

## 2020-07-13 LAB — COMPREHENSIVE METABOLIC PANEL
ALT: 11 U/L (ref 0–44)
AST: 21 U/L (ref 15–41)
Albumin: 2.8 g/dL — ABNORMAL LOW (ref 3.5–5.0)
Alkaline Phosphatase: 210 U/L — ABNORMAL HIGH (ref 38–126)
Anion gap: 9 (ref 5–15)
BUN: 10 mg/dL (ref 6–20)
CO2: 22 mmol/L (ref 22–32)
Calcium: 8.8 mg/dL — ABNORMAL LOW (ref 8.9–10.3)
Chloride: 105 mmol/L (ref 98–111)
Creatinine, Ser: 0.74 mg/dL (ref 0.44–1.00)
GFR, Estimated: >60 mL/min (ref >60)
Glucose, Bld: 79 mg/dL (ref 70–99)
Potassium: 3.9 mmol/L (ref 3.5–5.1)
Sodium: 136 mmol/L (ref 135–145)
Total Bilirubin: 0.7 mg/dL (ref 0.3–1.2)
Total Protein: 7.1 g/dL (ref 6.5–8.1)

## 2020-07-13 LAB — TYPE AND SCREEN
ABO/RH(D): O POS
Antibody Screen: NEGATIVE

## 2020-07-13 LAB — CBC
HCT: 34.2 % — ABNORMAL LOW (ref 36.0–46.0)
Hemoglobin: 11 g/dL — ABNORMAL LOW (ref 12.0–15.0)
MCH: 27.8 pg (ref 26.0–34.0)
MCHC: 32.2 g/dL (ref 30.0–36.0)
MCV: 86.6 fL (ref 80.0–100.0)
Platelets: 254 10*3/uL (ref 150–400)
RBC: 3.95 MIL/uL (ref 3.87–5.11)
RDW: 13.9 % (ref 11.5–15.5)
WBC: 8.8 10*3/uL (ref 4.0–10.5)
nRBC: 0 % (ref 0.0–0.2)

## 2020-07-13 LAB — RESP PANEL BY RT-PCR (FLU A&B, COVID) ARPGX2
Influenza A by PCR: NEGATIVE
Influenza B by PCR: NEGATIVE
SARS Coronavirus 2 by RT PCR: NEGATIVE

## 2020-07-13 LAB — HIV ANTIBODY (ROUTINE TESTING W REFLEX): HIV Screen 4th Generation wRfx: NONREACTIVE

## 2020-07-13 LAB — HEPATITIS B SURFACE ANTIGEN: Hepatitis B Surface Ag: NONREACTIVE

## 2020-07-13 SURGERY — Surgical Case
Anesthesia: Spinal

## 2020-07-13 MED ORDER — NALBUPHINE HCL 10 MG/ML IJ SOLN: 5.0000 mg | Freq: Once | INTRAMUSCULAR | Status: DC | PRN

## 2020-07-13 MED ORDER — ZOLPIDEM TARTRATE 5 MG PO TABS: 5.0000 mg | ORAL_TABLET | Freq: Every evening | ORAL | Status: DC | PRN

## 2020-07-13 MED ORDER — ORAL CARE MOUTH RINSE: 15.0000 mL | Freq: Once | OROMUCOSAL | Status: DC

## 2020-07-13 MED ORDER — OXYTOCIN-SODIUM CHLORIDE 30-0.9 UT/500ML-% IV SOLN
INTRAVENOUS | Status: AC
  Filled 2020-07-13: qty 500

## 2020-07-13 MED ORDER — KETOROLAC TROMETHAMINE 30 MG/ML IJ SOLN
INTRAMUSCULAR | Status: AC
  Filled 2020-07-13: qty 1

## 2020-07-13 MED ORDER — FENTANYL CITRATE (PF) 100 MCG/2ML IJ SOLN
INTRAMUSCULAR | Status: DC | PRN
  Administered 2020-07-13: 15 ug via INTRATHECAL

## 2020-07-13 MED ORDER — SODIUM CHLORIDE 0.9% FLUSH: 3.0000 mL | INTRAVENOUS | Status: DC | PRN

## 2020-07-13 MED ORDER — LACTATED RINGERS IV SOLN: INTRAVENOUS | Status: DC

## 2020-07-13 MED ORDER — ONDANSETRON HCL 4 MG/2ML IJ SOLN
INTRAMUSCULAR | Status: AC
  Filled 2020-07-13: qty 2

## 2020-07-13 MED ORDER — NALBUPHINE HCL 10 MG/ML IJ SOLN: 5.0000 mg | INTRAMUSCULAR | Status: DC | PRN

## 2020-07-13 MED ORDER — DIPHENHYDRAMINE HCL 25 MG PO CAPS: 25.0000 mg | ORAL_CAPSULE | ORAL | Status: DC | PRN

## 2020-07-13 MED ORDER — KETOROLAC TROMETHAMINE 30 MG/ML IJ SOLN: 30.0000 mg | Freq: Four times a day (QID) | INTRAMUSCULAR | Status: AC | PRN

## 2020-07-13 MED ORDER — CEFAZOLIN SODIUM-DEXTROSE 2-4 GM/100ML-% IV SOLN: 2.0000 g | INTRAVENOUS | Status: DC

## 2020-07-13 MED ORDER — PHENYLEPHRINE HCL-NACL 20-0.9 MG/250ML-% IV SOLN
INTRAVENOUS | Status: DC | PRN
  Administered 2020-07-13: 60 ug/min via INTRAVENOUS

## 2020-07-13 MED ORDER — NALOXONE HCL 0.4 MG/ML IJ SOLN: 0.4000 mg | INTRAMUSCULAR | Status: DC | PRN

## 2020-07-13 MED ORDER — DEXAMETHASONE SODIUM PHOSPHATE 10 MG/ML IJ SOLN
INTRAMUSCULAR | Status: AC
  Filled 2020-07-13: qty 1

## 2020-07-13 MED ORDER — DIBUCAINE (PERIANAL) 1 % EX OINT: 1.0000 "application " | TOPICAL_OINTMENT | CUTANEOUS | Status: DC | PRN

## 2020-07-13 MED ORDER — KETOROLAC TROMETHAMINE 30 MG/ML IJ SOLN
30.0000 mg | Freq: Four times a day (QID) | INTRAMUSCULAR | Status: AC | PRN
  Administered 2020-07-13: 30 mg via INTRAVENOUS

## 2020-07-13 MED ORDER — DIPHENHYDRAMINE HCL 50 MG/ML IJ SOLN: 12.5000 mg | INTRAMUSCULAR | Status: DC | PRN

## 2020-07-13 MED ORDER — KETOROLAC TROMETHAMINE 30 MG/ML IJ SOLN
30.0000 mg | Freq: Four times a day (QID) | INTRAMUSCULAR | Status: AC
  Administered 2020-07-14 (×3): 30 mg via INTRAVENOUS
  Filled 2020-07-13 (×3): qty 1

## 2020-07-13 MED ORDER — SCOPOLAMINE 1 MG/3DAYS TD PT72: 1.0000 | MEDICATED_PATCH | Freq: Once | TRANSDERMAL | Status: DC

## 2020-07-13 MED ORDER — ONDANSETRON HCL 4 MG/2ML IJ SOLN
INTRAMUSCULAR | Status: DC | PRN
  Administered 2020-07-13: 4 mg via INTRAVENOUS

## 2020-07-13 MED ORDER — ONDANSETRON HCL 4 MG/2ML IJ SOLN: 4.0000 mg | Freq: Three times a day (TID) | INTRAMUSCULAR | Status: DC | PRN

## 2020-07-13 MED ORDER — PROMETHAZINE HCL 25 MG/ML IJ SOLN: 6.2500 mg | INTRAMUSCULAR | Status: DC | PRN

## 2020-07-13 MED ORDER — SCOPOLAMINE 1 MG/3DAYS TD PT72
1.0000 | MEDICATED_PATCH | Freq: Once | TRANSDERMAL | Status: DC
  Administered 2020-07-13: 1.5 mg via TRANSDERMAL
  Filled 2020-07-13: qty 1

## 2020-07-13 MED ORDER — SIMETHICONE 80 MG PO CHEW
80.0000 mg | CHEWABLE_TABLET | Freq: Three times a day (TID) | ORAL | Status: DC
  Administered 2020-07-14 – 2020-07-15 (×3): 80 mg via ORAL
  Filled 2020-07-13 (×3): qty 1

## 2020-07-13 MED ORDER — FENTANYL CITRATE (PF) 100 MCG/2ML IJ SOLN
INTRAMUSCULAR | Status: AC
  Filled 2020-07-13: qty 2

## 2020-07-13 MED ORDER — ENOXAPARIN SODIUM 40 MG/0.4ML IJ SOSY
40.0000 mg | PREFILLED_SYRINGE | INTRAMUSCULAR | Status: DC
  Administered 2020-07-14 – 2020-07-15 (×2): 40 mg via SUBCUTANEOUS
  Filled 2020-07-13 (×2): qty 0.4

## 2020-07-13 MED ORDER — PRENATAL MULTIVITAMIN CH
1.0000 | ORAL_TABLET | Freq: Every day | ORAL | Status: DC
  Administered 2020-07-14 – 2020-07-15 (×2): 1 via ORAL
  Filled 2020-07-13 (×2): qty 1

## 2020-07-13 MED ORDER — DIPHENHYDRAMINE HCL 25 MG PO CAPS: 25.0000 mg | ORAL_CAPSULE | Freq: Four times a day (QID) | ORAL | Status: DC | PRN

## 2020-07-13 MED ORDER — OXYTOCIN-SODIUM CHLORIDE 30-0.9 UT/500ML-% IV SOLN
2.5000 [IU]/h | INTRAVENOUS | Status: AC
  Administered 2020-07-13: 2.5 [IU]/h via INTRAVENOUS

## 2020-07-13 MED ORDER — PHENYLEPHRINE HCL-NACL 20-0.9 MG/250ML-% IV SOLN
INTRAVENOUS | Status: AC
  Filled 2020-07-13: qty 250

## 2020-07-13 MED ORDER — ACETAMINOPHEN 500 MG PO TABS
1000.0000 mg | ORAL_TABLET | Freq: Four times a day (QID) | ORAL | Status: DC
  Administered 2020-07-13 – 2020-07-15 (×7): 1000 mg via ORAL
  Filled 2020-07-13 (×8): qty 2

## 2020-07-13 MED ORDER — OXYCODONE HCL 5 MG PO TABS: 5.0000 mg | ORAL_TABLET | Freq: Once | ORAL | Status: DC | PRN

## 2020-07-13 MED ORDER — BUPIVACAINE IN DEXTROSE 0.75-8.25 % IT SOLN
INTRATHECAL | Status: DC | PRN
  Administered 2020-07-13: 1.6 mL via INTRATHECAL

## 2020-07-13 MED ORDER — COCONUT OIL OIL: 1.0000 "application " | TOPICAL_OIL | Status: DC | PRN

## 2020-07-13 MED ORDER — MEPERIDINE HCL 25 MG/ML IJ SOLN: 6.2500 mg | INTRAMUSCULAR | Status: DC | PRN

## 2020-07-13 MED ORDER — MENTHOL 3 MG MT LOZG: 1.0000 | LOZENGE | OROMUCOSAL | Status: DC | PRN

## 2020-07-13 MED ORDER — IBUPROFEN 600 MG PO TABS
600.0000 mg | ORAL_TABLET | Freq: Four times a day (QID) | ORAL | Status: DC
  Administered 2020-07-14 – 2020-07-15 (×4): 600 mg via ORAL
  Filled 2020-07-13 (×4): qty 1

## 2020-07-13 MED ORDER — MORPHINE SULFATE (PF) 0.5 MG/ML IJ SOLN
INTRAMUSCULAR | Status: DC | PRN
  Administered 2020-07-13: .15 mg via INTRATHECAL

## 2020-07-13 MED ORDER — NALOXONE HCL 4 MG/10ML IJ SOLN
1.0000 ug/kg/h | INTRAVENOUS | Status: DC | PRN
  Filled 2020-07-13: qty 5

## 2020-07-13 MED ORDER — DEXAMETHASONE SODIUM PHOSPHATE 10 MG/ML IJ SOLN
INTRAMUSCULAR | Status: DC | PRN
  Administered 2020-07-13: 10 mg via INTRAVENOUS

## 2020-07-13 MED ORDER — TETANUS-DIPHTH-ACELL PERTUSSIS 5-2.5-18.5 LF-MCG/0.5 IM SUSY: 0.5000 mL | PREFILLED_SYRINGE | Freq: Once | INTRAMUSCULAR | Status: DC

## 2020-07-13 MED ORDER — CEFAZOLIN SODIUM-DEXTROSE 2-4 GM/100ML-% IV SOLN
2.0000 g | INTRAVENOUS | Status: AC
Start: 2020-07-13 — End: 2020-07-13
  Administered 2020-07-13: 2 g via INTRAVENOUS
  Filled 2020-07-13: qty 100

## 2020-07-13 MED ORDER — SOD CITRATE-CITRIC ACID 500-334 MG/5ML PO SOLN
30.0000 mL | ORAL | Status: AC
  Administered 2020-07-13: 30 mL via ORAL
  Filled 2020-07-13: qty 30

## 2020-07-13 MED ORDER — FAMOTIDINE 20 MG PO TABS
20.0000 mg | ORAL_TABLET | Freq: Once | ORAL | Status: AC
  Administered 2020-07-13: 20 mg via ORAL
  Filled 2020-07-13: qty 1

## 2020-07-13 MED ORDER — OXYCODONE HCL 5 MG/5ML PO SOLN: 5.0000 mg | Freq: Once | ORAL | Status: DC | PRN

## 2020-07-13 MED ORDER — CHLORHEXIDINE GLUCONATE 0.12 % MT SOLN
15.0000 mL | Freq: Once | OROMUCOSAL | Status: DC
  Filled 2020-07-13: qty 15

## 2020-07-13 MED ORDER — FERROUS SULFATE 325 (65 FE) MG PO TABS
325.0000 mg | ORAL_TABLET | ORAL | Status: DC
  Administered 2020-07-14 – 2020-07-15 (×2): 325 mg via ORAL
  Filled 2020-07-13 (×3): qty 1

## 2020-07-13 MED ORDER — MORPHINE SULFATE (PF) 0.5 MG/ML IJ SOLN
INTRAMUSCULAR | Status: AC
  Filled 2020-07-13: qty 10

## 2020-07-13 MED ORDER — OXYCODONE HCL 5 MG PO TABS: 5.0000 mg | ORAL_TABLET | ORAL | Status: DC | PRN

## 2020-07-13 MED ORDER — OXYTOCIN-SODIUM CHLORIDE 30-0.9 UT/500ML-% IV SOLN
INTRAVENOUS | Status: DC | PRN
  Administered 2020-07-13: 30 [IU] via INTRAVENOUS

## 2020-07-13 MED ORDER — FENTANYL CITRATE (PF) 100 MCG/2ML IJ SOLN: 25.0000 ug | INTRAMUSCULAR | Status: DC | PRN

## 2020-07-13 MED ORDER — WITCH HAZEL-GLYCERIN EX PADS: 1.0000 "application " | MEDICATED_PAD | CUTANEOUS | Status: DC | PRN

## 2020-07-13 MED ORDER — SODIUM CHLORIDE 0.9 % IR SOLN
Status: DC | PRN
  Administered 2020-07-13: 1

## 2020-07-13 MED ORDER — SOD CITRATE-CITRIC ACID 500-334 MG/5ML PO SOLN: 30.0000 mL | ORAL | Status: DC

## 2020-07-13 MED ORDER — SENNOSIDES-DOCUSATE SODIUM 8.6-50 MG PO TABS
2.0000 | ORAL_TABLET | ORAL | Status: DC
  Administered 2020-07-13: 2 via ORAL
  Filled 2020-07-13: qty 2

## 2020-07-13 MED ORDER — SIMETHICONE 80 MG PO CHEW: 80.0000 mg | CHEWABLE_TABLET | ORAL | Status: DC | PRN

## 2020-07-13 SURGICAL SUPPLY — 31 items
BENZOIN TINCTURE PRP APPL 2/3 (GAUZE/BANDAGES/DRESSINGS) ×3 IMPLANT
CLOSURE STERI STRIP 1/2 X4 (GAUZE/BANDAGES/DRESSINGS) ×3 IMPLANT
CLOSURE WOUND 1/2 X4 (GAUZE/BANDAGES/DRESSINGS)
CLOTH BEACON ORANGE TIMEOUT ST (SAFETY) ×3 IMPLANT
DRSG OPSITE POSTOP 4X10 (GAUZE/BANDAGES/DRESSINGS) ×3 IMPLANT
ELECT REM PT RETURN 9FT ADLT (ELECTROSURGICAL) ×3
ELECTRODE REM PT RTRN 9FT ADLT (ELECTROSURGICAL) ×1 IMPLANT
EXTRACTOR VACUUM KIWI (MISCELLANEOUS) IMPLANT
GAUZE SPONGE 4X4 12PLY STRL LF (GAUZE/BANDAGES/DRESSINGS) ×12 IMPLANT
GLOVE BIOGEL PI IND STRL 7.0 (GLOVE) ×1 IMPLANT
GLOVE BIOGEL PI INDICATOR 7.0 (GLOVE) ×2
GLOVE SURG ORTHO 8.0 STRL STRW (GLOVE) ×3 IMPLANT
GOWN STRL REUS W/TWL LRG LVL3 (GOWN DISPOSABLE) ×6 IMPLANT
KIT ABG SYR 3ML LUER SLIP (SYRINGE) IMPLANT
NEEDLE HYPO 25X5/8 SAFETYGLIDE (NEEDLE) IMPLANT
NS IRRIG 1000ML POUR BTL (IV SOLUTION) ×3 IMPLANT
PACK C SECTION WH (CUSTOM PROCEDURE TRAY) ×3 IMPLANT
PAD OB MATERNITY 4.3X12.25 (PERSONAL CARE ITEMS) ×3 IMPLANT
PENCIL SMOKE EVAC W/HOLSTER (ELECTROSURGICAL) ×3 IMPLANT
RTRCTR C-SECT PINK 25CM LRG (MISCELLANEOUS) IMPLANT
STRIP CLOSURE SKIN 1/2X4 (GAUZE/BANDAGES/DRESSINGS) IMPLANT
SUT MON AB-0 CT1 36 (SUTURE) ×6 IMPLANT
SUT PLAIN 0 NONE (SUTURE) IMPLANT
SUT VIC AB 0 CT1 27 (SUTURE) ×4
SUT VIC AB 0 CT1 27XBRD ANBCTR (SUTURE) ×2 IMPLANT
SUT VIC AB 2-0 CT1 27 (SUTURE) ×2
SUT VIC AB 2-0 CT1 TAPERPNT 27 (SUTURE) ×1 IMPLANT
SUT VIC AB 4-0 KS 27 (SUTURE) ×3 IMPLANT
TOWEL OR 17X24 6PK STRL BLUE (TOWEL DISPOSABLE) ×3 IMPLANT
TRAY FOLEY W/BAG SLVR 14FR LF (SET/KITS/TRAYS/PACK) ×3 IMPLANT
WATER STERILE IRR 1000ML POUR (IV SOLUTION) ×3 IMPLANT

## 2020-07-13 NOTE — Anesthesia Preprocedure Evaluation (Addendum)
Anesthesia Evaluation  Patient identified by MRN, date of birth, ID band Patient awake    Reviewed: Allergy & Precautions, NPO status , Patient's Chart, lab work & pertinent test results  History of Anesthesia Complications Negative for: history of anesthetic complications  Airway Mallampati: III  TM Distance: >3 FB Neck ROM: Full    Dental  (+) Dental Advisory Given   Pulmonary neg pulmonary ROS,    Pulmonary exam normal        Cardiovascular negative cardio ROS Normal cardiovascular exam     Neuro/Psych negative neurological ROS  negative psych ROS   GI/Hepatic Neg liver ROS,  Gastritis    Endo/Other   Obesity   Renal/GU negative Renal ROS     Musculoskeletal negative musculoskeletal ROS (+)   Abdominal   Peds  Hematology negative hematology ROS (+)   Anesthesia Other Findings   Reproductive/Obstetrics (+) Pregnancy                            Anesthesia Physical Anesthesia Plan  ASA: 2  Anesthesia Plan: Spinal   Post-op Pain Management:    Induction:   PONV Risk Score and Plan: 2 and Treatment may vary due to age or medical condition, Ondansetron and Scopolamine patch - Pre-op  Airway Management Planned: Natural Airway  Additional Equipment: None  Intra-op Plan:   Post-operative Plan:   Informed Consent: I have reviewed the patients History and Physical, chart, labs and discussed the procedure including the risks, benefits and alternatives for the proposed anesthesia with the patient or authorized representative who has indicated his/her understanding and acceptance.     Interpreter used for [employer] Discussed with: CRNA and Anesthesiologist  Anesthesia Plan Comments: (Labs reviewed. Platelets acceptable, patient not taking any blood thinning medications. Per RN, FHR tracing reported to be stable enough for sitting procedure. Risks and benefits discussed  with patient, including PDPH, backache, epidural hematoma, failed epidural, blood pressure changes, allergic reaction, and nerve injury. Patient expressed understanding and wished to proceed.)       Anesthesia Quick Evaluation

## 2020-07-13 NOTE — H&P (Signed)
Obstetric Preoperative History and Physical  Cassandra Durham is a 30 y.o. [id number] with IUP at [redacted]w[redacted]d presenting for scheduled cesarean section.  Pt was seen at MFM and had a BPP of 4/8.  Fetal breathing and movement were not observed.  EFW was at 8%.  Pt has had limited PNC through the [facility]  She has had two previous c sections outside the [place] and desires a repeat c section with BTL.  Prenatal Course Source of Care: [facility]  with onset of care at 37.2 weeks Pregnancy complications or risks: Patient Active Problem List   Diagnosis Date Noted   Cesarean delivery delivered 07/13/2020   History of cesarean delivery 07/13/2020   She plans to breastfeed She desires bilateral tubal ligation for postpartum contraception.   Prenatal labs and studies: ABO, Rh: --/--/O POS (06/23 1100) Antibody: NEG (06/23 1100) Rubella: 1.75 (05/19 1414) RPR: NON REACTIVE (05/19 1414)  HBsAg: NON REACTIVE (06/23 1100)  HIV: Non Reactive (06/23 1100)  GBS:  1 hr Glucola  not completed Genetic screening  not completed Anatomy US normal  Prenatal Transfer Tool  Maternal Diabetes: No Genetic Screening: Declined Maternal Ultrasounds/Referrals: Other:SGA, BPP 4/8 Fetal Ultrasounds or other Referrals:  Referred to Materal Fetal Medicine  Maternal Substance Abuse:  No Significant Maternal Medications:  None Significant Maternal Lab Results: Other:   GBS unknown  Past Medical History:  Diagnosis Date   Gastritis     Past Surgical History:  Procedure Laterality Date   CESAREAN SECTION      OB History  Gravida Para Term Preterm AB Living  SAB IAB Ectopic Multiple Live Births          2    # Outcome Date GA Lbr Len/2nd Weight Sex Delivery Anes PTL Lv  3 Current           2 Term 10/21/16 [redacted]w[redacted]d  3459 g M CS-LTranv EPI  LIV     Birth Comments: repeat c/s  1 Term 09/30/10 [redacted]w[redacted]d  3175 g M CS-LTranv   LIV     Birth Comments: c/s due to transverse, otherwise wnl    Social History    Socioeconomic History   Marital status: Single    Spouse name: Not on file   Number of children: Not on file   Years of education: Not on file   Highest education level: Not on file  Occupational History   Not on file  Tobacco Use   Smoking status: Never   Smokeless tobacco: Never  Vaping Use   Vaping Use: Never used  Substance and Sexual Activity   Alcohol use: Not Currently   Drug use: Never   Sexual activity: Yes  Other Topics Concern   Not on file  Social History Narrative   Not on file   Social Determinants of Health   Financial Resource Strain: Not on file  Food Insecurity: No Food Insecurity   Worried About Running Out of Food in the Last Year: Never true   Ran Out of Food in the Last Year: Never true  Transportation Needs: No Transportation Needs   Lack of Transportation (Medical): No   Lack of Transportation (Non-Medical): No  Physical Activity: Not on file  Stress: Not on file  Social Connections: Not on file    Family History  Problem Relation Age of Onset   Cancer Neg Hx    Diabetes Neg Hx    Hypertension Neg Hx    Obesity  Neg Hx     Medications Prior to Admission  Medication Sig Dispense Refill Last Dose   Prenatal Vit-Fe Fumarate-FA (PRENATAL VITAMINS PO) Take 1 tablet by mouth daily.   07/12/2020   pantoprazole (PROTONIX) 40 MG tablet TAKE 1 TABLET BY MOUTH EVERY DAY 30 tablet 0     No Known Allergies  Review of Systems: Negative except for what is mentioned in HPI.  Physical Exam: BP 120/87   Pulse 94   Temp 99.2 F (37.3 C) (Oral)   Resp 17   LMP 10/03/2019   SpO2 99%  FHR by Doppler: 140 bpm CONSTITUTIONAL: Well-developed, well-nourished female in no acute distress.  HENT:  Normocephalic, atraumatic. Oropharynx is clear and moist EYES: Conjunctivae and EOM are normal. No scleral icterus.  NECK: Normal range of motion, supple SKIN: Skin is warm and dry. No rash noted. Not diaphoretic. No erythema. NEUROLGIC: Alert and oriented to  person, place, and time. Normal reflexes, muscle tone coordination. No cranial nerve deficit noted. PSYCHIATRIC: Normal mood and affect. Normal behavior. CARDIOVASCULAR: Normal heart rate noted, regular rhythm RESPIRATORY: Effort and breath sounds normal, no problems with respiration noted ABDOMEN: Soft, nontender, nondistended, gravid. Well-healed Pfannenstiel incision. PELVIC: Deferred MUSCULOSKELETAL: Normal range of motion. No edema and no tenderness. 2+ distal pulses.   Pertinent Labs/Studies:   Results for orders placed or performed during the hospital encounter of 07/13/20 (from the past 72 hour(s))  Resp Panel by RT-PCR (Flu A&B, Covid) Nasopharyngeal Swab     Status: None   Collection Time: 07/13/20 10:44 AM   Specimen: Nasopharyngeal Swab; Nasopharyngeal(NP) swabs in vial transport medium  Result Value Ref Range   SARS Coronavirus 2 by RT PCR NEGATIVE NEGATIVE    Comment: (NOTE) SARS-CoV-2 target nucleic acids are NOT DETECTED.  The SARS-CoV-2 RNA is generally detectable in upper respiratory specimens during the acute phase of infection. The lowest concentration of SARS-CoV-2 viral copies this assay can detect is 138 copies/mL. A negative result does not preclude SARS-Cov-2 infection and should not be used as the sole basis for treatment or other patient management decisions. A negative result may occur with  improper specimen collection/handling, submission of specimen other than nasopharyngeal swab, presence of viral mutation(s) within the areas targeted by this assay, and inadequate number of viral copies(<138 copies/mL). A negative result must be combined with clinical observations, patient history, and epidemiological information. The expected result is Negative.  Fact Sheet for Patients:  BloggerCourse.com  Fact Sheet for Healthcare Providers:  SeriousBroker.it  This test is no t yet approved or cleared by the  [place] FDA and  has been authorized for detection and/or diagnosis of SARS-CoV-2 by FDA under an Emergency Use Authorization (EUA). This EUA will remain  in effect (meaning this test can be used) for the duration of the COVID-19 declaration under Section 564(b)(1) of the Act, 21 U.S.C.section 360bbb-3(b)(1), unless the authorization is terminated  or revoked sooner.       Influenza A by PCR NEGATIVE NEGATIVE   Influenza B by PCR NEGATIVE NEGATIVE    Comment: (NOTE) The Xpert Xpress SARS-CoV-2/FLU/RSV plus assay is intended as an aid in the diagnosis of influenza from Nasopharyngeal swab specimens and should not be used as a sole basis for treatment. Nasal washings and aspirates are unacceptable for Xpert Xpress SARS-CoV-2/FLU/RSV testing.  Fact Sheet for Patients: BloggerCourse.com  Fact Sheet for Healthcare Providers: SeriousBroker.it  This test is not yet approved or cleared by the [place] and has been authorized for  detection and/or diagnosis of SARS-CoV-2 by FDA under an Emergency Use Authorization (EUA). This EUA will remain in effect (meaning this test can be used) for the duration of the COVID-19 declaration under Section 564(b)(1) of the Act, 21 U.S.C. section 360bbb-3(b)(1), unless the authorization is terminated or revoked.  Performed at [facility], [address] [place], [place] [address]   Protein / creatinine ratio, urine     Status: Abnormal   Collection Time: 07/13/20 10:50 AM  Result Value Ref Range   Creatinine, Urine 150.66 mg/dL   Total Protein, Urine 35 mg/dL    Comment: NO NORMAL RANGE ESTABLISHED FOR THIS TEST   Protein Creatinine Ratio 0.23 (H) 0.00 - 0.15 mg/mg[Cre]    Comment: Performed at [facility], [address] [place], [place] [address]  Hepatitis B surface antigen     Status: None   Collection Time: 07/13/20 11:00 AM  Result Value Ref Range   Hepatitis B  Surface Ag NON REACTIVE NON REACTIVE    Comment: Performed at [facility], [address] [place], [place] [address]  CBC     Status: Abnormal   Collection Time: 07/13/20 11:00 AM  Result Value Ref Range   WBC 8.8 4.0 - 10.5 K/uL   RBC 3.95 3.87 - 5.11 MIL/uL   Hemoglobin 11.0 (L) 12.0 - 15.0 g/dL   HCT 83.1 (L) 51.7 - 61.6 %   MCV 86.6 80.0 - 100.0 fL   MCH 27.8 26.0 - 34.0 pg   MCHC 32.2 30.0 - 36.0 g/dL   RDW 07.3 71.0 - 62.6 %   Platelets 254 150 - 400 K/uL   nRBC 0.0 0.0 - 0.2 %    Comment: Performed at [facility], [address] [place], [place] [address]  Differential     Status: None   Collection Time: 07/13/20 11:00 AM  Result Value Ref Range   Neutrophils Relative % 73 %   Neutro Abs 6.5 1.7 - 7.7 K/uL   Lymphocytes Relative 16 %   Lymphs Abs 1.4 0.7 - 4.0 K/uL   Monocytes Relative 8 %   Monocytes Absolute 0.7 0.1 - 1.0 K/uL   Eosinophils Relative 1 %   Eosinophils Absolute 0.1 0.0 - 0.5 K/uL   Basophils Relative 1 %   Basophils Absolute 0.0 0.0 - 0.1 K/uL   Immature Granulocytes 1 %   Abs Immature Granulocytes 0.04 0.00 - 0.07 K/uL    Comment: Performed at [facility], [address] [place], [place] [address]  Type and screen [facility]     Status: None   Collection Time: 07/13/20 11:00 AM  Result Value Ref Range   ABO/RH(D) O POS    Antibody Screen NEG    Sample Expiration      07/16/2020,2359 Performed at [facility], [address], [place], [place] [address]   HIV Antibody (routine testing w rflx)     Status: None   Collection Time: 07/13/20 11:00 AM  Result Value Ref Range   HIV Screen 4th Generation wRfx Non Reactive Non Reactive    Comment: Performed at [facility], [address] [place], [place] [address]  Comprehensive metabolic panel     Status: Abnormal   Collection Time: 07/13/20 11:00 AM  Result Value Ref Range   Sodium 136 135 - 145 mmol/L   Potassium 3.9 3.5 - 5.1 mmol/L   Chloride  105 98 - 111  mmol/L   CO2 22 22 - 32 mmol/L   Glucose, Bld 79 70 - 99 mg/dL    Comment: Glucose reference range applies only to samples taken after fasting for at least 8 hours.   BUN 10 6 - 20 mg/dL   Creatinine, Ser 5.42 0.44 - 1.00 mg/dL   Calcium 8.8 (L) 8.9 - 10.3 mg/dL   Total Protein 7.1 6.5 - 8.1 g/dL   Albumin 2.8 (L) 3.5 - 5.0 g/dL   AST 21 15 - 41 U/L   ALT 11 0 - 44 U/L   Alkaline Phosphatase 210 (H) 38 - 126 U/L   Total Bilirubin 0.7 0.3 - 1.2 mg/dL   GFR, Estimated >70 >62 mL/min    Comment: (NOTE) Calculated using the CKD-EPI Creatinine Equation (2021)    Anion gap 9 5 - 15    Comment: Performed at [facility], [address] [place], [place] [address]  Performed By  Attending:        Noralee Space MD        Secondary Phy.:   [facility]-                                                             Faculty Physician  Performed By:     Sandi Mealy        Address:          [address]                    [address]                                                             [place], [place]                                                             [address]  Referred By:      Ladean Raya              Location:         Center for [facility]  NP                               Fetal Care at                                                             [facility]                                                             Women  Ref. Address:     [facility]                    [address]                    [place] ---------------------------------------------------------------------- Orders  #  Description                           Code        Ordered By  1  [place] MFM OB DETAIL +14 WK               [id number]    Melvenia Needles  2  [place] MFM UA CORD DOPPLER                76820.02    RAVI SHANKAR  3  [place] MFM FETAL BPP WO NON               76819.01    RAVI [facility]     STRESS ----------------------------------------------------------------------  #  Order #                     Accession #                Episode #  1  [id number]                   [id number]  2  [id number]                   [id number]  3  [id number]                   [id number]                 [id number] ---------------------------------------------------------------------- Indications  Late to prenatal care, third trimester  O09.33  Uterine size-date discrepancy, third trimester O26.843  Encounter for antenatal screening for          Z36.3  malformations  [redacted] weeks gestation of pregnancy                Z3A.38 ---------------------------------------------------------------------- Fetal Evaluation  Num Of Fetuses:         1  Fetal Heart Rate(bpm):  130  Cardiac Activity:       Observed  Presentation:           Cephalic  Placenta:               Posterior  P. Cord Insertion:      Not well visualized  Amniotic Fluid  AFI FV:      Within normal limits  AFI Sum(cm)     %Tile       Largest Pocket(cm)  9.58            23          3.13  RUQ(cm)       RLQ(cm)       LUQ(cm)        LLQ(cm)  3.13          0.74          2.72           2.99 ---------------------------------------------------------------------- Biophysical Evaluation  Amniotic F.V:   Pocket => 2 cm             F. Tone:        Not Observed  F. Movement:    Observed                   Score:          4/8  F. Breathing:   Not Observed ---------------------------------------------------------------------- Biometry  BPD:        88  mm     G. Age:  35w 4d         11  %    CI:        72.24   %    70 - 86                                                          FL/HC:      20.2   %    20.9 - 22.7  HC:      329.4  mm     G.  Age:  37w 3d         17  %    HC/AC:      1.04        0.92 - 1.05  AC:      316.5  mm     G. Age:  35w 4d          7  %    FL/BPD:     75.6   %    71 - 87  FL:       66.5  mm     G. Age:  34w 2d        < 1  %    FL/AC:      21.0   %    20 - 24  HUM:      60.4  mm  G. Age:  35w 0d         18  %  LV:        3.1  mm  Est. FW:    2673  gm    5 lb 14 oz       8  % ---------------------------------------------------------------------- OB History  Gravidity:    3         Term:   2        Prem:   0        SAB:   0  TOP:          0       Ectopic:  0        Living: 2 ---------------------------------------------------------------------- Gestational Age  LMP:           40w 4d        Date:  10/03/19                 EDD:   07/09/20  U/S Today:     35w 5d                                        EDD:   08/12/20  Best:          38w 1d     Det. By:  Previous Ultrasound      EDD:   07/26/20                                      (06/22/20) ---------------------------------------------------------------------- Anatomy  Cranium:               Appears normal         LVOT:                   Not well visualized  Cavum:                 Appears normal         Aortic Arch:            Not well visualized  Ventricles:            Appears normal         Ductal Arch:            Not well visualized  Choroid Plexus:        Not well visualized    Diaphragm:              Appears normal  Cerebellum:            Appears normal         Stomach:                Appears normal, left                                                                        sided  Posterior Fossa:       Appears normal         Abdomen:  Appears normal  Nuchal Fold:           Not applicable (>20    Abdominal Wall:         Not well visualized                         wks GA)  Face:                  Not well visualized    Cord Vessels:           Appears normal (3                                                                        vessel  cord)  Lips:                  Appears normal         Kidneys:                Appear normal  Palate:                Not well visualized    Bladder:                Appears normal  Thoracic:              Appears normal         Spine:                  Appears normal                         Appears normal  Heart:                 Appears normal         Upper Extremities:      Not well visualized                         (4CH, axis, and                         situs)  RVOT:                  Not well visualized    Lower Extremities:      Not well visualized  Other:  Technically difficult due to advanced GA and fetal position. Nasal          bone visualized. ---------------------------------------------------------------------- Doppler - Fetal Vessels  Umbilical Artery   S/D     %tile      RI    %tile      PI    %tile            ADFV    RDFV   2.48       63     0.6       71    0.92       76               No      No ---------------------------------------------------------------------- Impression  G3 P2 t 38w 1d gestation.  Patient is  here for ultrasound  evaluation.  Her pregnancy is dated by third trimester  ultrasound.  She is scheduled to undergo cesarean delivery next week.  Blood pressure today at her office is 141/95 mmHg.  On today's ultrasound amniotic fluid is normal and good fetal  activity seen.  Fetal breathing movements and tone did not  meet the criteria BPP.  The estimated fetal weight is at the 8  percentile and the abdominal circumference measurement is  at the 7th percentile.  Umbilical artery Doppler showed  normal forward diastolic flow.  Fetal anatomical survey  appears normal but very limited by advanced gestational age.  Cephalic presentation.  I counseled the patient with help of Spanish language  interpreter present in the room.  I explained fetal growth  restriction and poor biophysical profile and recommended  delivery.  Alternatively, if labor and delivery cannot  accommodate  patients, patient should be monitored at MAU and have  repeat BPP in 3 to 4 hours.  Patient agreed to go to the MAU.  Dr. Donavan Foil was informed of the findings.  Assessment and Plan :Solina Heron is a 30 y.o. G3P2002 at [redacted]w[redacted]d being admitted for scheduled cesarean section. The risks of cesarean section discussed with the patient included but were not limited to: bleeding which may require transfusion or reoperation; infection which may require antibiotics; injury to bowel, bladder, ureters or other surrounding organs; injury to the fetus; need for additional procedures including hysterectomy in the event of a life-threatening hemorrhage; placental abnormalities wth subsequent pregnancies, incisional problems, thromboembolic phenomenon and other postoperative/anesthesia complications. The patient concurred with the proposed plan, giving informed written consent for the procedure. Patient has been NPO since this morning, and she will remain NPO for procedure. Pt also desires tubal ligation.  Anesthesia and OR aware. Preoperative prophylactic antibiotics and SCDs ordered on call to the OR. To OR when ready.    Mariel Aloe, MD Faculty attending, Center for [facility] 07/13/2020, 1:07 PM

## 2020-07-13 NOTE — MAU Provider Note (Signed)
  History     CSN: [id number]  Arrival date and time: 07/13/20 1007   None     No chief complaint on file.  HPI  Ms.Cassandra Romero-Orellanais a 30 y.o. female [phone] @ [redacted]w[redacted]d here from the MFM office. She had a BPP today and score 4/8. She is here for monitoring/ delivery. She reports starting care late at the HD. She has had 4 visit with them. She has a history of 2 prior C[place] in [place] and plans a repeat with this pregnancy. She has normal fetal movement at this time. No bleeding or leaking of fluid  Interpretor present for visit.   OB History     Gravida  3   Para  2   Term  2   Preterm      AB      Living  2      SAB      IAB      Ectopic      Multiple      Live Births  2           Past Medical History:  Diagnosis Date   Gastritis     Past Surgical History:  Procedure Laterality Date   CESAREAN SECTION      Family History  Problem Relation Age of Onset   Cancer Neg Hx    Diabetes Neg Hx    Hypertension Neg Hx    Obesity Neg Hx     Social History   Tobacco Use   Smoking status: Never   Smokeless tobacco: Never  Vaping Use   Vaping Use: Never used  Substance Use Topics   Alcohol use: Not Currently   Drug use: Never    Allergies: No Known Allergies  Medications Prior to Admission  Medication Sig Dispense Refill Last Dose   Prenatal Vit-Fe Fumarate-FA (PRENATAL VITAMINS PO) Take 1 tablet by mouth daily.   07/12/2020   pantoprazole (PROTONIX) 40 MG tablet TAKE 1 TABLET BY MOUTH EVERY DAY 30 tablet 0     Review of Systems  Constitutional:  Negative for fever.  Gastrointestinal:  Negative for abdominal pain.  Genitourinary:  Negative for vaginal bleeding and vaginal discharge.  Physical Exam   Blood pressure 124/87, pulse 74, temperature 99.2 F (37.3 C), temperature source Oral, resp. rate 17, last menstrual period 10/03/2019, SpO2 99 %.  Physical Exam Constitutional:      General: She is not in acute distress.     Appearance: Normal appearance. She is not ill-appearing, toxic-appearing or diaphoretic.  HENT:     Head: Normocephalic.  Eyes:     Pupils: Pupils are equal, round, and reactive to light.  Skin:    General: Skin is warm.  Neurological:     Mental Status: She is alert and oriented to person, place, and time.     Deep Tendon Reflexes: Reflexes normal.     Comments: Negative clonus   Fetal Tracing: Baseline: 135 bpm Variability: Moderate  Accelerations: 15x15 Decelerations: None Toco: Occasional   MAU Course  Procedures  MDM  38w 1d with elevated BP and BPP 4/8. Discussed delivery with Dr. Donavan Foil who is agreeable with plan of care.   Assessment and Plan   A:  1. Elevated BP without diagnosis of hypertension      P:  Care turned over to Dr. Donavan Foil NPO Prep for OR  [name], [name], NP 07/13/2020 1:43 PM

## 2020-07-13 NOTE — Progress Notes (Signed)
Patient counseled regarding bilateral tubal ligation. Reviewed that this is a permanent procedure and that she will not be able to have children after it is done. Reviewed risks of bilateral tubal ligation including infection, hemorrhage, damage to surrounding tissue and organs, risk of regret. Reviewed that bilateral tubal ligation is not 100% effective and she should take a pregnancy test if she believes for any reason she may be pregnant. Reviewed slightly increased risk of ectopic pregnancy and need to seek care if she becomes pregnant. She understands this is an elective procedure and again affirms her desire. Consent signed, also consents to blood transfusion if necessary.  Procedure will be completed during her cesarean section.   Mariel Aloe, MD Faculty Attending, Center for [facility]

## 2020-07-13 NOTE — Op Note (Addendum)
Operative Note   SURGERY DATE: 07/13/2020  PRE-OP DIAGNOSIS:  *Pregnancy at [redacted]w[redacted]d * History of two prior cesarean sections *Undesired fertility  *4/8 BPP   POST-OP DIAGNOSIS: Same   PROCEDURE: repeat low transverse cesarean section via pfannenstiel skin incision with single layer uterine closure and bilateral salpingectomy   SURGEON: Surgeon(s) and Role:    Warden Fillers, MD - Primary    * Marsala, Arlana Pouch, MD  ANESTHESIA: spinal  ESTIMATED BLOOD LOSS:  178  DRAINS: UOP via indwelling foley  TOTAL IV FLUIDS: 1000 mL crystalloid  VTE PROPHYLAXIS: SCDs to bilateral lower extremities  ANTIBIOTICS: Two grams of Cefazolin were given., within 1 hour of skin incision  SPECIMENS: placenta to L&D, bilateral fallopian tube segments   COMPLICATIONS: none  INDICATIONS: History of two prior cesarean sections, 4/8 BPP today. Undesired fertility  FINDINGS: Bladder adhered to anterior uterine segment, mild intra-abdominal adhesions were noted. Grossly normal uterus, tubes and ovaries. Clear amniotic fluid, cephalic female infant, weight pending, APGARs 8/9, intact placenta.  PROCEDURE IN DETAIL: The patient was taken to the operating room where anesthesia was administered and normal fetal heart tones were confirmed. She was then prepped and draped in the normal fashion in the dorsal supine position with a leftward tilt.  After a time out was performed, a pfannensteil skin incision was made with the scalpel and carried through to the underlying layer of fascia. The fascia was then incised at the midline and this incision was extended laterally with the mayo scissors. Attention was turned to the superior aspect of the fascial incision which was grasped with the kocher clamps x 2, tented up and the rectus muscles were dissected off with the mayo scissors. In a similar fashion the inferior aspect of the fascial incision was grasped with the kocher clamps, tented up and the rectus muscles  dissected off with the mayo scissors. The rectus muscles were then separated in the midline and the peritoneum was entered bluntly. The alexis retractor was inserted and the vesicouterine peritoneum was identified, tented up and entered with the metzenbaum scissors. This incision was extended laterally and the bladder flap was created digitally.   A low transverse hysterotomy was made with the scalpel until the endometrial cavity was breached and the amniotic sac ruptured with the Allis clamp, yielding clear amniotic fluid. This incision was extended bluntly and the infant's head, shoulders and body were delivered atraumatically.The cord was clamped x 2 and cut, and the infant was handed to the awaiting pediatricians, after delayed cord clamping was done.  The placenta was then gradually expressed from the uterus and then the uterus was cleared of all clots and debris. The hysterotomy was repaired with a running suture of 1-0 monocryl. A second imbricating layer of 1-0 monocryl suture was then placed. A single figure-of-eight sutures of 1-0 monocryl was added to achieve excellent hemostasis.    The left fallopian tube was grasped with Babcock clamps and followed out to the fimbriated end. The distal 5 cm portion of the tube and mesosalpinx was doubly clamped using Kelly clamps and the distal portion of the tube removed using Metzenbaum scissors. The remaining pedicle was tied off using a 2-0 Vicryl free tie and a subsequent ligation with 2-0 Vicryl. The pedicle was inspected and found to be hemostatic.  A similar process was carried out on the right  side allowing for bilateral tubal sterilization.  Good hemostasis was noted overall.    The hysterotomy and all operative sites  were reinspected and excellent hemostasis was noted after irrigation and suction of the abdomen with warm saline.  The fascia was reapproximated with 0 Vicryl in a simple running fashion bilaterally. The subcutaneous layer was then  reapproximated with interrupted sutures of 2-0 plain gut, and the skin was then closed with 4-0 monocryl, in a subcuticular fashion.  The patient  tolerated the procedure well. Sponge, lap, needle, and instrument counts were correct x 2. The patient was transferred to the recovery room awake, alert and breathing independently in stable condition.   Casper Harrison, MD [facility] Fellow, [facility] for [facility], [facility]

## 2020-07-13 NOTE — Transfer of Care (Signed)
Immediate Anesthesia Transfer of Care Note  Patient: Cassandra [facility]  Procedure(s) Performed: CESAREAN SECTION  Patient Location: PACU  Anesthesia Type:Spinal  Level of Consciousness: awake, alert  and oriented  Airway & Oxygen Therapy: Patient Spontanous Breathing  Post-op Assessment: Report given to RN and Post -op Vital signs reviewed and stable  Post vital signs: Reviewed and stable  Last Vitals:  Vitals Value Taken Time  BP 115/75 07/13/20 1736  Temp    Pulse 71 07/13/20 1739  Resp 18 07/13/20 1739  SpO2 99 % 07/13/20 1739  Vitals shown include unvalidated device data.  Last Pain:  Vitals:   07/13/20 1553  TempSrc:   PainSc: 0-No pain         Complications: No notable events documented.

## 2020-07-13 NOTE — Progress Notes (Signed)
Spanish interpreter Cassandra Durham present for entirety of c-section.  Cassandra Durham

## 2020-07-13 NOTE — MAU Note (Signed)
...  Cassandra Durham is a 30 y.o. at [redacted]w[redacted]d here in MAU reporting: [facility]. Patient states she had an ultrasound today and they told her they did not see the baby move. RN asked the patient if she was feeling fetal movement and the patient stated, "yes." The patient is new to the area and has had no prenatal care prior to the [facility]. The patient brought documents with her to MAU. Upon looking at the paperwork the patient had an MFM visit today. Patient unable to clarify whether or not she had a BPP performed. No VB or LOF.

## 2020-07-13 NOTE — Discharge Summary (Signed)
Postpartum Discharge Summary     Patient Name: Cassandra Durham DOB: 02/20/1990 MRN: [id number]  Date of admission: 07/13/2020 Delivery date:07/13/2020  Delivering provider: Griffin Basil  Date of discharge: 07/15/2020  Admitting diagnosis: Cesarean delivery delivered [O82] History of cesarean delivery [Z98.891] Intrauterine pregnancy: [redacted]w[redacted]d    Secondary diagnosis:  Active Problems:   Cesarean delivery delivered   History of cesarean delivery   Gestational hypertension  Additional problems: none    Discharge diagnosis: Term Pregnancy Delivered and Gestational Hypertension                                              Post partum procedures:postpartum tubal ligation Augmentation: N/A Complications: None  Hospital course: Sceduled C/S   30y.o. yo G3P2002 at [id number] admitted to the hospital 07/13/2020 for scheduled cesarean section with the following indication:Elective Repeat and Non-Reassuring FHR (abnormal BPP) Delivery details are as follows:  Membrane Rupture Time/Date: 4:44 PM ,07/13/2020   Delivery Method:C-Section, Low Transverse  Details of operation can be found in separate operative note.  Patient had an uncomplicated postpartum course.  She is ambulating, tolerating a regular diet, passing flatus, and urinating well. Patient is discharged home in stable condition on  07/15/20        Newborn Data: Birth date:07/13/2020  Birth time:4:44 PM  Gender:Female  Living status:Living  Apgars:8 ,9  Weight:2910 g     Magnesium Sulfate received: No BMZ received: No Rhophylac:N/A MMR:N/A, immune T-DaP:Given prenatally Flu: N/A Transfusion:No  Physical exam  Vitals:   07/14/20 1008 07/14/20 1336 07/14/20 2108 07/15/20 0427  BP: 116/79 101/67 109/78 128/79  Pulse: 81 62 60 61  Resp: '18 18 18 18  ' Temp: 98.2 F (36.8 C) 98.2 F (36.8 C) 97.8 F (36.6 C) 98.2 F (36.8 C)  TempSrc: Oral Oral Oral Oral  SpO2: 98% 97% 97% 100%   General: alert, cooperative, and  no distress Lochia: appropriate Uterine Fundus: firm Incision: Healing well with no significant drainage DVT Evaluation: No evidence of DVT seen on physical exam. Labs: Lab Results  Component Value Date   WBC 8.8 07/13/2020   HGB 11.0 (L) 07/13/2020   HCT 34.2 (L) 07/13/2020   MCV 86.6 07/13/2020   PLT 254 07/13/2020   CMP Latest Ref Rng & Units 07/13/2020  Glucose 70 - 99 mg/dL 79  BUN 6 - 20 mg/dL 10  Creatinine 0.44 - 1.00 mg/dL 0.74  Sodium 135 - 145 mmol/L 136  Potassium 3.5 - 5.1 mmol/L 3.9  Chloride 98 - 111 mmol/L 105  CO2 22 - 32 mmol/L 22  Calcium 8.9 - 10.3 mg/dL 8.8(L)  Total Protein 6.5 - 8.1 g/dL 7.1  Total Bilirubin 0.3 - 1.2 mg/dL 0.7  Alkaline Phos 38 - 126 U/L 210(H)  AST 15 - 41 U/L 21  ALT 0 - 44 U/L 11   Edinburgh Score: No flowsheet data found.   After visit meds:  Allergies as of 07/15/2020   No Known Allergies      Medication List     TAKE these medications    acetaminophen 500 MG tablet Commonly known as: TYLENOL Take 2 tablets (1,000 mg total) by mouth every 6 (six) hours.   ibuprofen 600 MG tablet Commonly known as: ADVIL Take 1 tablet (600 mg total) by mouth every 6 (six) hours.   oxyCODONE 5 MG immediate  release tablet Commonly known as: Oxy IR/ROXICODONE Take 1-2 tablets (5-10 mg total) by mouth every 4 (four) hours as needed for moderate pain.   pantoprazole 40 MG tablet Commonly known as: PROTONIX TAKE 1 TABLET BY MOUTH EVERY DAY   PRENATAL VITAMINS PO Take 1 tablet by mouth daily.        Discharge home in stable condition Infant Feeding: Breast Infant Disposition:home with mother Discharge instruction: per After Visit Summary and Postpartum booklet. Activity: Advance as tolerated. Pelvic rest for 6 weeks.  Diet: routine diet Future Appointments: Future Appointments  Date Time Provider [place]  07/20/2020 10:35 AM Griffin Basil, MD [facility]  07/27/2020  3:35 PM Griffin Basil, MD [facility]    Follow up Visit:  Patient instructed to follow up with [facility] for 6 week postpartum visit. Message sent to [facility] for one week BP and incision check by Berniece Andreas MD on 6/23.     Renee Harder, MSN, CNM 07/15/20 11:52 AM

## 2020-07-13 NOTE — Anesthesia Procedure Notes (Signed)
Spinal  Patient location during procedure: OR Start time: 07/13/2020 4:09 PM End time: 07/13/2020 4:12 PM Reason for block: surgical anesthesia Staffing Performed: anesthesiologist  Anesthesiologist: Beryle Lathe, MD Preanesthetic Checklist Completed: patient identified, IV checked, risks and benefits discussed, surgical consent, monitors and equipment checked, pre-op evaluation and timeout performed Spinal Block Patient position: sitting Prep: DuraPrep Patient monitoring: heart rate, cardiac monitor, continuous pulse ox and blood pressure Approach: midline Location: L3-4 Injection technique: single-shot Needle Needle type: Pencan  Needle gauge: 24 G Additional Notes Consent was obtained prior to the procedure with all questions answered and concerns addressed. Risks including, but not limited to, bleeding, infection, nerve damage, paralysis, failed block, inadequate analgesia, allergic reaction, high spinal, itching, and headache were discussed and the patient wished to proceed. Functioning IV was confirmed and monitors were applied. Sterile prep and drape, including hand hygiene, mask, and sterile gloves were used. The patient was positioned and the spine was prepped. The skin was anesthetized with lidocaine. Free flow of clear CSF was obtained prior to injecting local anesthetic into the CSF. The spinal needle aspirated freely following injection. The needle was carefully withdrawn. The patient tolerated the procedure well.   Leslye Peer, MD

## 2020-07-14 ENCOUNTER — Encounter (HOSPITAL_COMMUNITY): Payer: Self-pay | Admitting: Obstetrics and Gynecology

## 2020-07-14 DIAGNOSIS — D62 Acute posthemorrhagic anemia: Secondary | ICD-10-CM | POA: Diagnosis not present

## 2020-07-14 LAB — RPR: RPR Ser Ql: NONREACTIVE

## 2020-07-14 LAB — CBC
HCT: 27.8 % — ABNORMAL LOW (ref 36.0–46.0)
Hemoglobin: 9.1 g/dL — ABNORMAL LOW (ref 12.0–15.0)
MCH: 28.1 pg (ref 26.0–34.0)
MCHC: 32.7 g/dL (ref 30.0–36.0)
MCV: 85.8 fL (ref 80.0–100.0)
Platelets: 219 10*3/uL (ref 150–400)
RBC: 3.24 MIL/uL — ABNORMAL LOW (ref 3.87–5.11)
RDW: 13.8 % (ref 11.5–15.5)
WBC: 11.3 10*3/uL — ABNORMAL HIGH (ref 4.0–10.5)
nRBC: 0 % (ref 0.0–0.2)

## 2020-07-14 LAB — RUBELLA SCREEN: Rubella: 1.66 index (ref >0.99)

## 2020-07-14 MED ORDER — ASCORBIC ACID 500 MG PO TABS
250.0000 mg | ORAL_TABLET | ORAL | Status: DC
  Filled 2020-07-14: qty 1

## 2020-07-14 NOTE — Progress Notes (Signed)
Subjective: POD#1 rLTCS/BTL  Patient is doing well without complaints. Has not yet ambulated. Foley catheter in place, has not voided yet. passing flatus. Tolerating PO. Abdominal pain improved. Vaginal bleeding decreased.  Objective: Vital signs in last 24 hours: Temp:  [97.9 F (36.6 C)-99.2 F (37.3 C)] 97.9 F (36.6 C) (06/24 0625) Pulse Rate:  [53-106] 57 (06/24 0625) Resp:  [14-23] 18 (06/24 0625) BP: (109-151)/(75-103) 109/75 (06/24 0625) SpO2:  [96 %-100 %] 98 % (06/24 5361)  Physical Exam:  General: alert, cooperative, and no distress Lochia: appropriate Uterine Fundus: firm Incision: pressure dressing c/d/i DVT Evaluation: No evidence of DVT seen on physical exam.  Recent Labs    07/13/20 1100 07/14/20 0529  HGB 11.0* 9.1*  HCT 34.2* 27.8*    Assessment/Plan: POD#1 rLTCS/BTL  -doing well without complaints  -monitor for void, flatus, ambulation  -hgb 9.1, PO iron started  #gHTN  -BP wnl  -asymptomatic, not on meds  #Limited PNC  -SW ordered Plan for discharge POD#2 or #3.  Alric Seton 07/14/2020, 7:19 AM

## 2020-07-14 NOTE — Clinical Social Work Maternal (Signed)
CLINICAL SOCIAL WORK MATERNAL/CHILD NOTE  Patient Details  Name: Cassandra [facility] MRN: [id number] Date of Birth: 01/30/1990  Date:  07/14/2020  Clinical Social Worker Initiating Note:  Cassandra [place], [place] Date/Time: Initiated:  07/14/20/1000     Child's Name:  Cassandra [facility]   Biological Parents:  Mother, Father Cassandra Durham 30)   Need for Interpreter:  None   Reason for Referral:  Late or No Prenatal Care     Address:  [address] [place] [address]    Phone number:  [phone] (home)     Additional phone number:   Household Members/Support Persons (HM/SP):   Household Member/Support Person 1   HM/SP Name Relationship DOB or Age  HM/SP -Cassandra Durham 38  HM/SP -2        HM/SP -3        HM/SP -4        HM/SP -5        HM/SP -6        HM/SP -7        HM/SP -8          Natural Supports (not living in the home):  Immediate Family   Professional Supports: None   Employment: Unemployed   Type of Work:     Education:  Programmer, systems   Homebound arranged:    Museum/gallery curator Resources:      Other Resources:  Cassandra Durham   Cultural/Religious Considerations Which May Impact Care:    Strengths:  Ability to meet basic needs  , Pediatrician chosen, Home prepared for child     Psychotropic Medications:         Pediatrician:    Cassandra Durham area  Pediatrician List:   Cassandra Durham ([address])  [place]      Pediatrician Fax Number:    Risk Factors/Current Problems:  None   Cognitive State:  Linear Thinking  , Alert  , Insightful     Mood/Affect:  Calm  , Interested     CSW Assessment: CSW consulted for limited prenatal care. CSW met with MOB to complete assessment and offer support. CSW utilized [place] [phone]. Infant was observed sleeping in bassinet. CSW introduced self, role and  reason for consult. MOB expressed understanding and reported she started prenatal care in [place] at 2 months and received regular care until she moved to [facility] in March. MOB stated she tried to schedule care once she was moved, however she was unable to establish an appointment earlier than 37 weeks. CSW expressed understanding and informed MOB of the hospital drug screen policy related to [facility]. MOB informed an UDS/CDS will be completed on infant and a CPS report will be made if infant test positive for substances. MOB was understanding and denies any substance use.   CSW assessed MOB social situation. MOB reported she resides with her female cousin who is her primary support. FOB resides in [place]. MOB stated her other children are also in [place], living with her mother. MOB is already enrolled in [facility] resources. MOB denies any mental health history and reported she is currently feeling well. MOB denies any current SI, HI or being involved in DV.   CSW provided education regarding the baby blues period versus PPD.  CSW provided the New Mom Checklist and encouraged MOB to  self evaluate and contact a medical professional if symptoms are noted at any time.    CSW provided review of Sudden Infant Death Syndrome (SIDS) precautions. MOB reported she has all essentials for infant, including a crib and car seat. MOB stated there are no transportation barriers to follow-up care. MOB reported she has no additional needs at this time.  CSW will continue to follow UDS/CDS and make a CPS report if warranted. CSW identifies no further need for intervention and no barriers to discharge at this time.  CSW Plan/Description:  No Further Intervention Required/No Barriers to Discharge, CSW Will Continue to Monitor Umbilical Cord Tissue Drug Screen Results and Make Report if Warranted, Child Protective Service Report  , Cassandra Durham, Perinatal Mood and Anxiety Disorder (PMADs) Education,  Sudden Infant Death Syndrome (SIDS) Education, Other Information/Referral to [employer], Other Patient/Family Education    Cassandra [place], [place] 07/14/2020, 11:40 AM

## 2020-07-14 NOTE — Anesthesia Postprocedure Evaluation (Signed)
Anesthesia Post Note  Patient: Cassandra Durham  Procedure(s) Performed: CESAREAN SECTION     Patient location during evaluation: PACU Anesthesia Type: Spinal Level of consciousness: awake and alert Pain management: pain level controlled Vital Signs Assessment: post-procedure vital signs reviewed and stable Respiratory status: spontaneous breathing and respiratory function stable Cardiovascular status: blood pressure returned to baseline and stable Postop Assessment: spinal receding and no apparent nausea or vomiting Anesthetic complications: no   No notable events documented.  Last Vitals:  Vitals:   07/14/20 0417 07/14/20 0625  BP:  109/75  Pulse: (!) 53 (!) 57  Resp: 18 18  Temp:  36.6 C  SpO2: 96% 98%    Last Pain:  Vitals:   07/14/20 0625  TempSrc: Oral  PainSc: 0-No pain                 Beryle Lathe

## 2020-07-15 MED ORDER — IBUPROFEN 600 MG PO TABS: 600.0000 mg | ORAL_TABLET | Freq: Four times a day (QID) | ORAL | 0 refills | Status: AC

## 2020-07-15 MED ORDER — OXYCODONE HCL 5 MG PO TABS: 5.0000 mg | ORAL_TABLET | ORAL | 0 refills | Status: AC | PRN

## 2020-07-15 MED ORDER — ACETAMINOPHEN 500 MG PO TABS: 1000.0000 mg | ORAL_TABLET | Freq: Four times a day (QID) | ORAL | 0 refills | Status: DC

## 2020-07-15 NOTE — Progress Notes (Signed)
Discharge education completed using in house interpreter [place].  Patient voiced understanding and all questions were answered.  Arun Herrod, [place]

## 2020-07-15 NOTE — Plan of Care (Signed)
  Problem: Education: Goal: Knowledge of condition will improve 07/15/2020 0431 by Yancey Flemings, RN Outcome: Completed/Met 07/15/2020 0431 by Yancey Flemings, RN Outcome: Adequate for Discharge   Problem: Activity: Goal: Will verbalize the importance of balancing activity with adequate rest periods 07/15/2020 0431 by Yancey Flemings, RN Outcome: Completed/Met 07/15/2020 0431 by Yancey Flemings, RN Outcome: Adequate for Discharge Goal: Ability to tolerate increased activity will improve 07/15/2020 0431 by Yancey Flemings, RN Outcome: Completed/Met 07/15/2020 0431 by Yancey Flemings, RN Outcome: Adequate for Discharge   Problem: Life Cycle: Goal: Chance of risk for complications during the postpartum period will decrease 07/15/2020 0431 by Yancey Flemings, RN Outcome: Completed/Met 07/15/2020 0431 by Yancey Flemings, RN Outcome: Adequate for Discharge

## 2020-07-15 NOTE — Plan of Care (Signed)
  Problem: Education: Goal: Knowledge of condition will improve Outcome: Adequate for Discharge   Problem: Activity: Goal: Will verbalize the importance of balancing activity with adequate rest periods Outcome: Adequate for Discharge Goal: Ability to tolerate increased activity will improve Outcome: Adequate for Discharge   Problem: Life Cycle: Goal: Chance of risk for complications during the postpartum period will decrease Outcome: Adequate for Discharge

## 2020-07-15 NOTE — Progress Notes (Signed)
Rn used in house interpreter ( X2 ) Edward Jolly and  [employer] interpreter (x2) . Mom would like an early discharge if possible.

## 2020-07-17 ENCOUNTER — Other Ambulatory Visit (HOSPITAL_COMMUNITY): Payer: Self-pay

## 2020-07-17 ENCOUNTER — Encounter (HOSPITAL_COMMUNITY)
Admission: RE | Admit: 2020-07-17 | Discharge: 2020-07-17 | Disposition: A | Discharge status: Home / Self Care | Payer: Self-pay | Source: Ambulatory Visit | Attending: Obstetrics and Gynecology | Admitting: Obstetrics and Gynecology

## 2020-07-17 LAB — SURGICAL PATHOLOGY

## 2020-07-18 ENCOUNTER — Encounter (HOSPITAL_COMMUNITY): Payer: Self-pay | Admitting: Anesthesiology

## 2020-07-18 NOTE — Anesthesia Preprocedure Evaluation (Deleted)
Anesthesia Evaluation    Reviewed: Allergy & Precautions, Patient's Chart, lab work & pertinent test results  Airway        Dental   Pulmonary neg pulmonary ROS,           Cardiovascular hypertension,      Neuro/Psych negative neurological ROS  negative psych ROS   GI/Hepatic Neg liver ROS, GERD  Medicated and Controlled,  Endo/Other  negative endocrine ROS  Renal/GU negative Renal ROS  negative genitourinary   Musculoskeletal negative musculoskeletal ROS (+)   Abdominal   Peds  Hematology  (+) Blood dyscrasia, anemia , hct 27.8, plt 219   Anesthesia Other Findings   Reproductive/Obstetrics (+) Pregnancy 2 prior sections                              Anesthesia Physical Anesthesia Plan  ASA: 2  Anesthesia Plan: Spinal   Post-op Pain Management:    Induction:   PONV Risk Score and Plan: 3 and Ondansetron, Dexamethasone and Treatment may vary due to age or medical condition  Airway Management Planned: Natural Airway and Nasal Cannula  Additional Equipment: None  Intra-op Plan:   Post-operative Plan:   Informed Consent:   Plan Discussed with:   Anesthesia Plan Comments:         Anesthesia Quick Evaluation

## 2020-07-19 ENCOUNTER — Encounter (HOSPITAL_COMMUNITY): Admission: RE | Payer: Self-pay | Source: Home / Self Care

## 2020-07-19 ENCOUNTER — Inpatient Hospital Stay (HOSPITAL_COMMUNITY): Admission: RE | Admit: 2020-07-19 | Payer: Self-pay | Source: Home / Self Care | Admitting: Obstetrics and Gynecology

## 2020-07-19 SURGERY — Surgical Case
Anesthesia: Regional

## 2020-07-20 ENCOUNTER — Other Ambulatory Visit: Payer: Self-pay

## 2020-07-20 ENCOUNTER — Encounter (INDEPENDENT_AMBULATORY_CARE_PROVIDER_SITE_OTHER): Payer: Self-pay | Admitting: Obstetrics and Gynecology

## 2020-07-20 ENCOUNTER — Encounter: Payer: Self-pay | Admitting: Obstetrics and Gynecology

## 2020-07-20 ENCOUNTER — Ambulatory Visit (INDEPENDENT_AMBULATORY_CARE_PROVIDER_SITE_OTHER): Payer: Medicaid Other | Admitting: Obstetrics and Gynecology

## 2020-07-20 DIAGNOSIS — O165 Unspecified maternal hypertension, complicating the puerperium: Secondary | ICD-10-CM | POA: Diagnosis not present

## 2020-07-20 DIAGNOSIS — Z4889 Encounter for other specified surgical aftercare: Secondary | ICD-10-CM | POA: Diagnosis not present

## 2020-07-20 MED ORDER — AMLODIPINE BESYLATE 5 MG PO TABS: 5.0000 mg | ORAL_TABLET | Freq: Every day | ORAL | 2 refills | Status: AC

## 2020-07-20 NOTE — Progress Notes (Signed)
Erroneous encounter

## 2020-07-20 NOTE — Progress Notes (Signed)
  CC: c section follow up Subjective:    Patient ID: Cassandra [facility], female    DOB: 28-Jul-1990, 30 y.o.   MRN: [id number]  HPI Pt came to office since she already had appointment scheduled.  Blood pressure noted to be elevated, but she is otherwise asymptomatic.  Post c section she has been doing well.  Pain well controlled, normal bowel and bladder function noted.  Pt still had honeycomb in place, but it was removed in the office.   Review of Systems     Objective:   Physical Exam Vitals:   07/20/20 1116 07/20/20 1118  BP: (!) 135/105 (!) 129/98  Pulse: 94   Wound: C/D/I, honeycomb and steristrips both removed.  No drainage noted       Assessment & Plan:   1. Encounter for postoperative care Wound healing well.  Pt doing well postop  2. Postpartum hypertension BP check in 1 week  - amLODipine (NORVASC) 5 MG tablet; Take 1 tablet (5 mg total) by mouth daily.  Dispense: 30 tablet; Refill: 2    Warden Fillers, MD Faculty Attending, Center for [facility]

## 2020-07-25 ENCOUNTER — Telehealth (HOSPITAL_COMMUNITY): Payer: Self-pay

## 2020-07-25 NOTE — Telephone Encounter (Signed)
Spanish Interpreter # [phone] used  "I'm doing fine, doing good." Mother declines any questions or concerns about her healing or her C/S.  "He is doing good. He is completely fine. He has been to the pediatrician. He is feeding well and having good diapers." Mother has no questions or concerns about baby.  EPDS 0   Heron Nay 07/25/2020,1614

## 2020-07-27 ENCOUNTER — Telehealth: Payer: Self-pay

## 2020-07-27 ENCOUNTER — Encounter: Payer: Self-pay | Admitting: Obstetrics and Gynecology

## 2020-07-27 ENCOUNTER — Ambulatory Visit: Payer: Self-pay

## 2020-07-27 NOTE — Telephone Encounter (Signed)
Called pt with interpreter Eda; VM left stating pt missed appt today and we would like to reschedule. Callback number given.

## 2020-08-11 ENCOUNTER — Other Ambulatory Visit: Payer: Self-pay | Admitting: Student

## 2020-10-28 ENCOUNTER — Encounter: Payer: Self-pay | Admitting: Radiology

## 2022-03-07 IMAGING — US US ABDOMEN COMPLETE
1 series · 15 of 25 positions shown · non-contrast
Comparison: None.

CLINICAL DATA: Epigastric abdominal pain, elevated liver function
tests, pregnant

EXAM:
ABDOMEN ULTRASOUND COMPLETE

[Series 1: us abdomen complete · 15 of 82 slices shown]
[im 1/82]
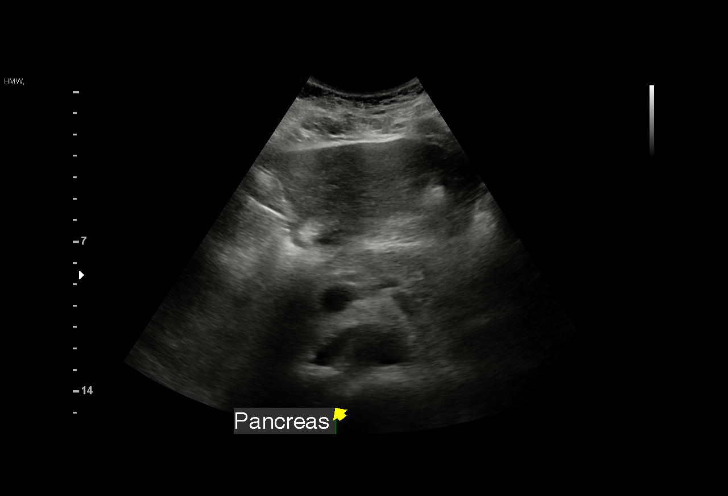
[im 7/82]
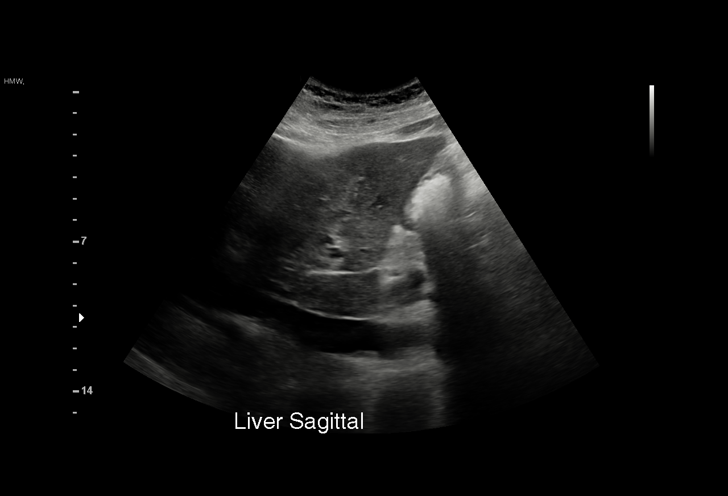
[im 14/82]
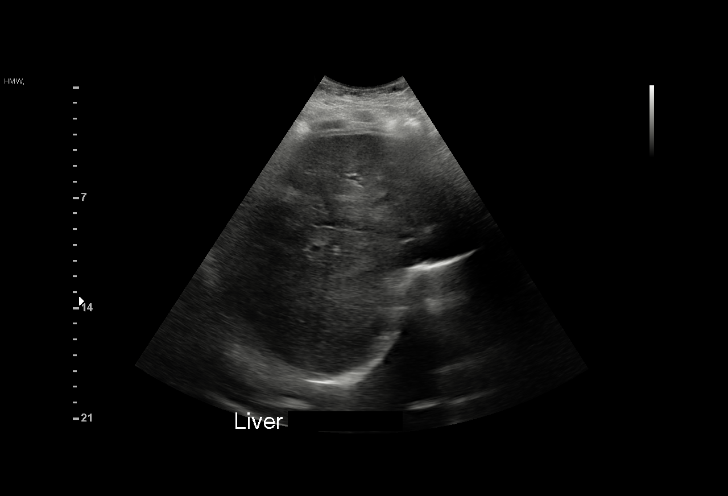
[im 17/82]
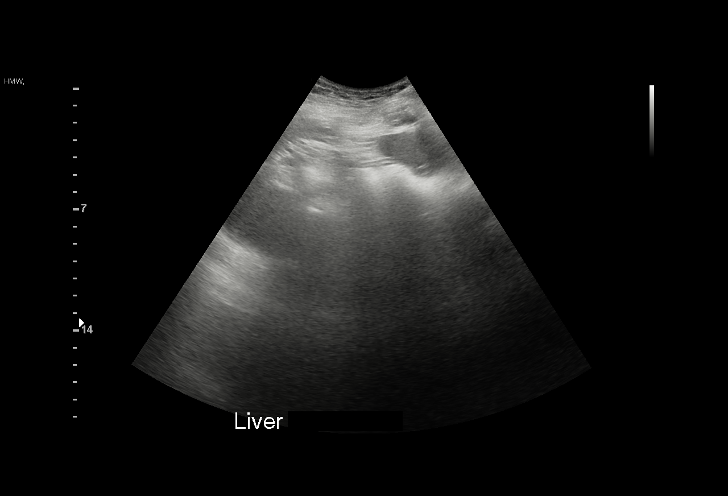
[im 24/82]
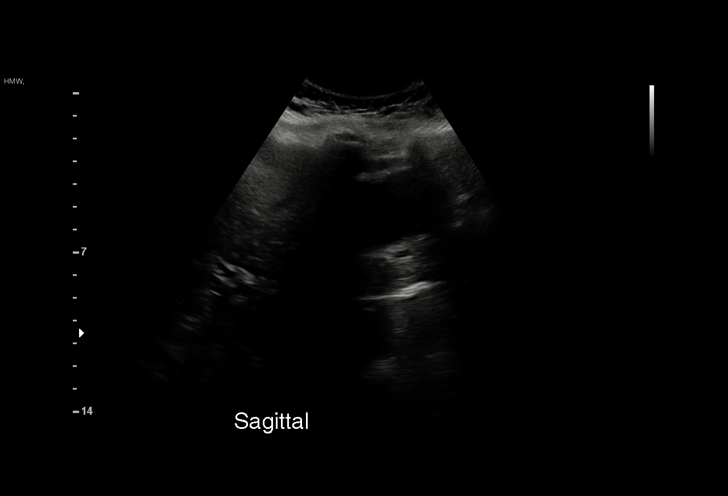
[im 31/82]
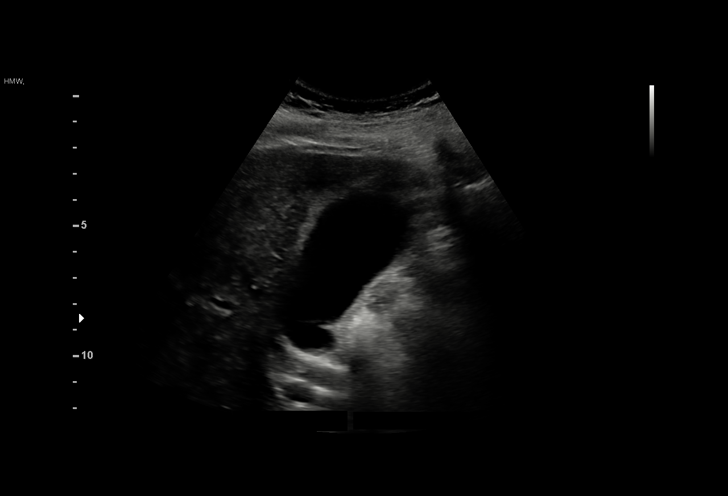
[im 34/82]
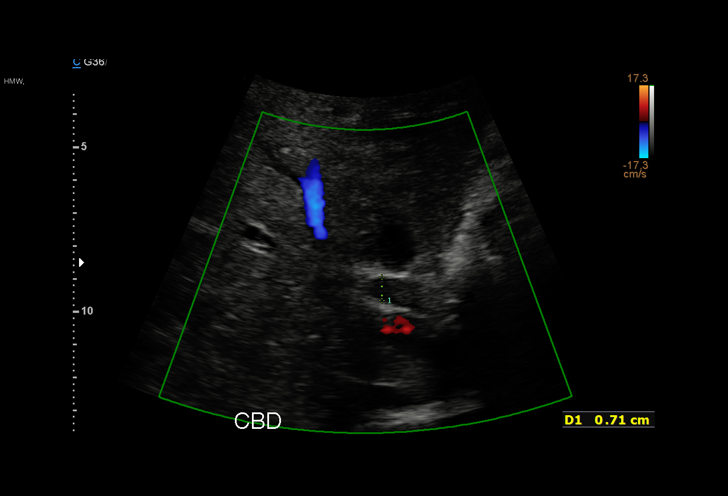
[im 41/82]
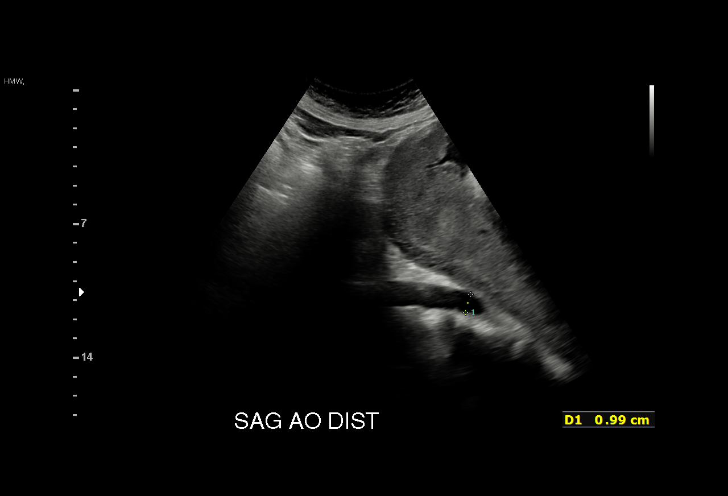
[im 48/82]
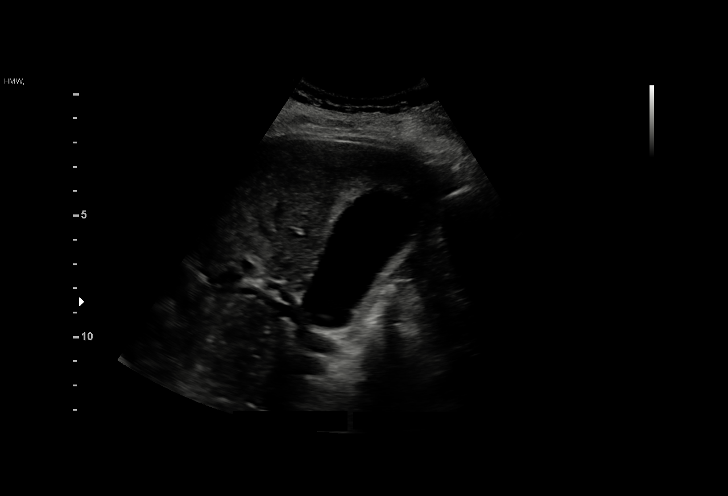
[im 51/82]
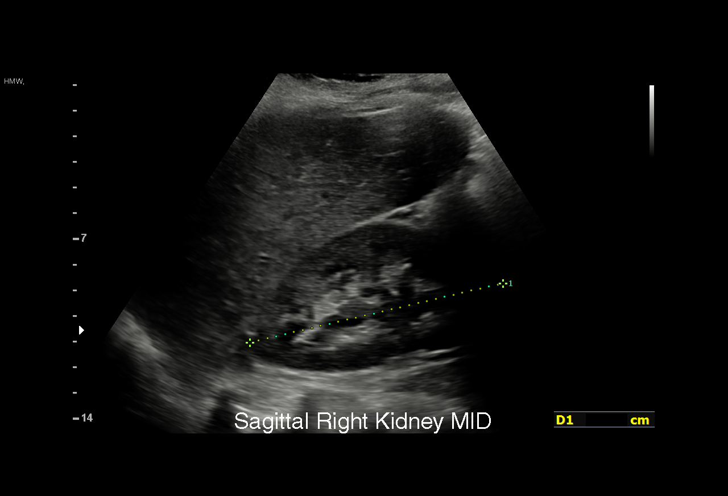
[im 58/82]
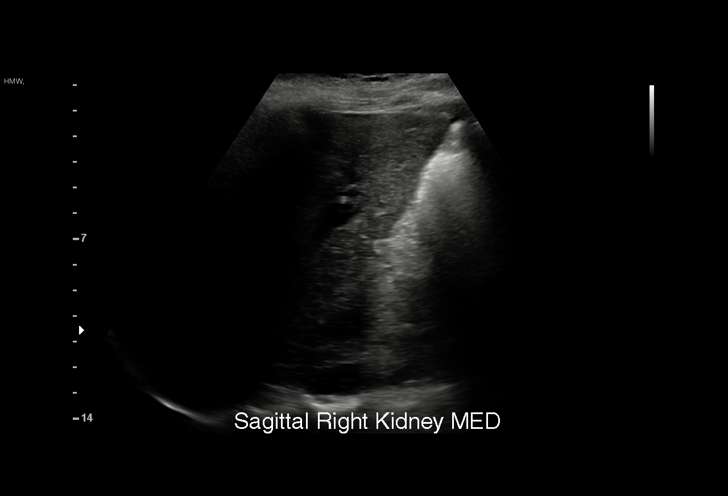
[im 65/82]
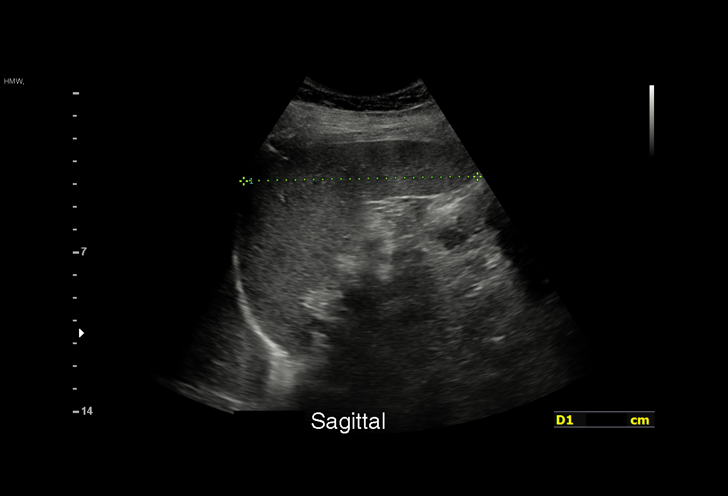
[im 68/82]
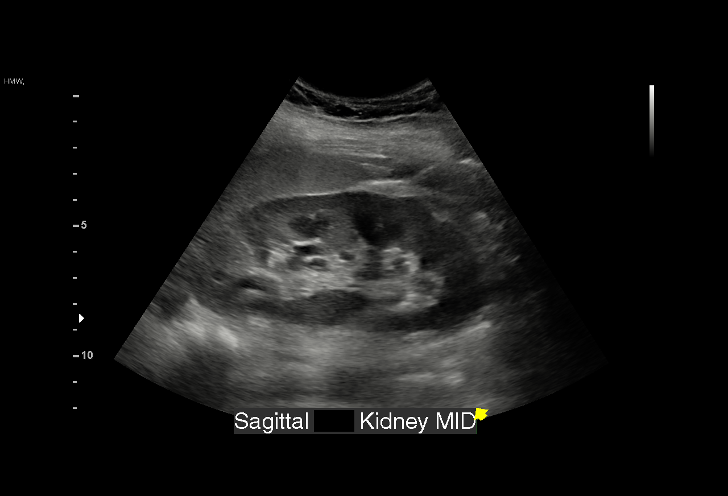
[im 75/82]
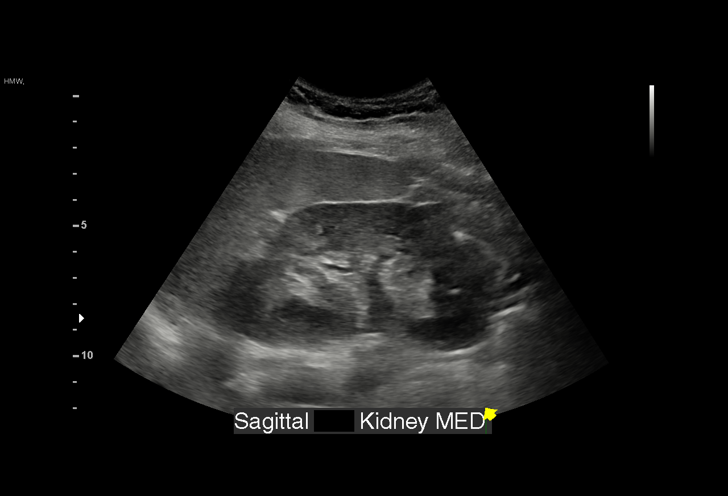
[im 82/82]
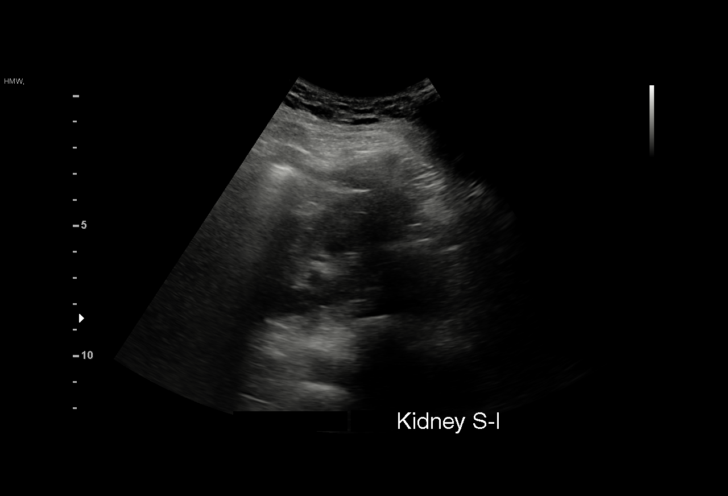

[15 of 25 positions shown; findings below may reference images not displayed]

FINDINGS: Gallbladder: No gallstones or wall thickening visualized. No
sonographic Murphy sign noted by sonographer.

Common bile duct: Diameter: 7 mm in proximal diameter

Liver: There is slightly limited evaluation of the right hepatic
dome. The visualized liver, however, demonstrates normal parenchymal
echogenicity and echotexture. Liver size is within normal limits. No
intrahepatic masses are identified. No intrahepatic biliary ductal
dilation. No perihepatic fluid collections are seen. Portal vein is
patent on color Doppler imaging with normal direction of blood flow
towards the liver.

IVC: No abnormality visualized.

Pancreas: Obscured by overlying bowel gas

Spleen: Size and appearance within normal limits.

Right Kidney: Length: 10.1 cm. Echogenicity within normal limits. No
mass or hydronephrosis visualized.

Left Kidney: Length: 11.1 cm. Echogenicity within normal limits. No
mass or hydronephrosis visualized.

Abdominal aorta: Partially obscured by overlying bowel gas, however,
the proximal and distal segments are normal in diameter.

Other findings: None.
IMPRESSION: Dilation of the extrahepatic bile duct. A distal obstructing
process, such as choledocholithiasis is not excluded. ERCP or MRCP
examination may be more helpful for further evaluation in this
patient with reported abnormal liver function tests.
